# Patient Record
Sex: Female | Born: 1935 | Race: White | Hispanic: No | Marital: Single
Health system: Southern US, Community
[De-identification: ages and names within clinical notes are randomized; demographics above are authoritative.]

## PROBLEM LIST (undated history)

## (undated) DIAGNOSIS — I10 Essential (primary) hypertension: Secondary | ICD-10-CM

## (undated) DIAGNOSIS — N170 Acute kidney failure with tubular necrosis: Secondary | ICD-10-CM

## (undated) DIAGNOSIS — J189 Pneumonia, unspecified organism: Secondary | ICD-10-CM

## (undated) DIAGNOSIS — E039 Hypothyroidism, unspecified: Secondary | ICD-10-CM

## (undated) DIAGNOSIS — E785 Hyperlipidemia, unspecified: Secondary | ICD-10-CM

## (undated) DIAGNOSIS — J869 Pyothorax without fistula: Secondary | ICD-10-CM

## (undated) DIAGNOSIS — I4891 Unspecified atrial fibrillation: Secondary | ICD-10-CM

## (undated) DIAGNOSIS — J9621 Acute and chronic respiratory failure with hypoxia: Secondary | ICD-10-CM

## (undated) DIAGNOSIS — G894 Chronic pain syndrome: Secondary | ICD-10-CM

## (undated) HISTORY — PX: TRACHEOSTOMY: SUR1362

---

## 2017-12-16 ENCOUNTER — Inpatient Hospital Stay
Admission: RE | Admit: 2017-12-16 | Discharge: 2018-01-07 | Disposition: A | Discharge status: Skilled Nursing Facility | Payer: Managed Care, Other (non HMO) | Attending: Internal Medicine | Admitting: Internal Medicine

## 2017-12-16 DIAGNOSIS — K567 Ileus, unspecified: Secondary | ICD-10-CM

## 2017-12-16 DIAGNOSIS — J9621 Acute and chronic respiratory failure with hypoxia: Secondary | ICD-10-CM | POA: Diagnosis present

## 2017-12-16 DIAGNOSIS — N170 Acute kidney failure with tubular necrosis: Secondary | ICD-10-CM | POA: Diagnosis present

## 2017-12-16 DIAGNOSIS — J9690 Respiratory failure, unspecified, unspecified whether with hypoxia or hypercapnia: Secondary | ICD-10-CM

## 2017-12-16 DIAGNOSIS — J869 Pyothorax without fistula: Secondary | ICD-10-CM | POA: Diagnosis present

## 2017-12-16 DIAGNOSIS — J189 Pneumonia, unspecified organism: Secondary | ICD-10-CM | POA: Diagnosis present

## 2017-12-16 DIAGNOSIS — I4891 Unspecified atrial fibrillation: Secondary | ICD-10-CM | POA: Diagnosis present

## 2017-12-16 HISTORY — DX: Essential (primary) hypertension: I10

## 2017-12-16 HISTORY — DX: Hyperlipidemia, unspecified: E78.5

## 2017-12-16 HISTORY — DX: Pneumonia, unspecified organism: J18.9

## 2017-12-16 HISTORY — DX: Chronic pain syndrome: G89.4

## 2017-12-16 HISTORY — DX: Unspecified atrial fibrillation: I48.91

## 2017-12-16 HISTORY — DX: Acute kidney failure with tubular necrosis: N17.0

## 2017-12-16 HISTORY — DX: Pyothorax without fistula: J86.9

## 2017-12-16 HISTORY — DX: Acute and chronic respiratory failure with hypoxia: J96.21

## 2017-12-16 HISTORY — DX: Hypothyroidism, unspecified: E03.9

## 2017-12-17 ENCOUNTER — Other Ambulatory Visit (HOSPITAL_COMMUNITY): Payer: Self-pay

## 2017-12-17 DIAGNOSIS — J189 Pneumonia, unspecified organism: Secondary | ICD-10-CM | POA: Diagnosis not present

## 2017-12-17 DIAGNOSIS — I4891 Unspecified atrial fibrillation: Secondary | ICD-10-CM | POA: Diagnosis not present

## 2017-12-17 DIAGNOSIS — J9621 Acute and chronic respiratory failure with hypoxia: Secondary | ICD-10-CM

## 2017-12-17 DIAGNOSIS — N170 Acute kidney failure with tubular necrosis: Secondary | ICD-10-CM

## 2017-12-17 DIAGNOSIS — J869 Pyothorax without fistula: Secondary | ICD-10-CM

## 2017-12-17 LAB — BASIC METABOLIC PANEL
ANION GAP: 9 (ref 5–15)
BUN: 15 mg/dL (ref 8–23)
CALCIUM: 8.9 mg/dL (ref 8.9–10.3)
CO2: 29 mmol/L (ref 22–32)
CREATININE: 0.59 mg/dL (ref 0.44–1.00)
Chloride: 100 mmol/L (ref 98–111)
GFR calc Af Amer: >60 mL/min (ref >60)
GLUCOSE: 131 mg/dL — AB (ref 70–99)
Potassium: 4 mmol/L (ref 3.5–5.1)
Sodium: 138 mmol/L (ref 135–145)

## 2017-12-17 LAB — LIPID PANEL
CHOL/HDL RATIO: 4.3 ratio
Cholesterol: 166 mg/dL (ref 0–200)
HDL: 39 mg/dL — ABNORMAL LOW (ref >40)
LDL Cholesterol: 100 mg/dL — ABNORMAL HIGH (ref 0–99)
Triglycerides: 137 mg/dL (ref <150)
VLDL: 27 mg/dL (ref 0–40)

## 2017-12-17 LAB — CBC
HCT: 36.2 % (ref 36.0–46.0)
Hemoglobin: 11.5 g/dL — ABNORMAL LOW (ref 12.0–15.0)
MCH: 28.7 pg (ref 26.0–34.0)
MCHC: 31.8 g/dL (ref 30.0–36.0)
MCV: 90.3 fL (ref 80.0–100.0)
NRBC: 0 % (ref 0.0–0.2)
PLATELETS: 216 10*3/uL (ref 150–400)
RBC: 4.01 MIL/uL (ref 3.87–5.11)
RDW: 17.3 % — AB (ref 11.5–15.5)
WBC: 7.7 10*3/uL (ref 4.0–10.5)

## 2017-12-17 LAB — HEMOGLOBIN A1C
Hgb A1c MFr Bld: 6.5 % — ABNORMAL HIGH (ref 4.8–5.6)
MEAN PLASMA GLUCOSE: 139.85 mg/dL

## 2017-12-17 LAB — TSH: TSH: 38.688 u[IU]/mL — ABNORMAL HIGH (ref 0.350–4.500)

## 2017-12-17 LAB — TROPONIN I

## 2017-12-17 LAB — CK: CK TOTAL: 12 U/L — AB (ref 38–234)

## 2017-12-17 MED ORDER — IOPAMIDOL (ISOVUE-300) INJECTION 61%
INTRAVENOUS | Status: AC
  Administered 2017-12-17: 40 mL
  Filled 2017-12-17: qty 50

## 2017-12-17 NOTE — Consult Note (Signed)
Pulmonary Critical Care Medicine [facility] [place]  PULMONARY SERVICE  Date of Service: 12/17/2017  PULMONARY CRITICAL CARE CONSULT   Natasha Jennings  ZOX:[id number]  DOB: January 18, 1936   DOA: 12/16/2017  Referring Physician: Carron Curie, MD  HPI: Natasha Jennings is a 82 y.o. female seen for follow up of Acute on Chronic Respiratory Failure.  Patient transferred to our facility for further management has a past medical history significant for hypertension hypothyroidism hyperlipidemia and chronic pain syndrome.  Patient presented to the hospital because she was having decreased oral intake.  Patient was also having a lot of weakness.  When she was seen in the emergency department she was noted to be in atrial fibrillation.  Her heart rate was significantly elevated and also she was found to be having a bilateral pneumonia.  Patient was started on sepsis protocol admitted to the hospital decompensated in the been the ICU on the ventilator.  Chest x-ray was done which revealed a left-sided empyema she did undergo thoracentesis and then she had a VATS done for drainage of the empyema.  Her course progressed with some improvement however she was not able to wean and eventually ended up having to be trached.  She is now on T collar at the time of presentation to our facility and has been resting on the ventilator on a wean on T collar.  Review of Systems:  ROS performed and is unremarkable other than noted above.  Past Medical History:  Diagnosis Date  . Acute on chronic respiratory failure with hypoxia (HCC)   . Acute renal failure due to tubular necrosis (HCC)   . Atrial fibrillation with rapid ventricular response (HCC)   . Chronic pain syndrome   . Community acquired pneumonia, bilateral   . Empyema of left pleural space (HCC)   . Hyperlipidemia   . Hypertension   . Hypothyroidism     Past Surgical History:  Procedure Laterality Date  . TRACHEOSTOMY      Social History:     has an unknown smoking status. She has never used smokeless tobacco. She reports that she drank alcohol. She reports that she has current or past drug history.  Family History: Non-Contributory to the present illness  Allergies not on file  Medications: Reviewed on Rounds  Physical Exam:  Vitals: Temperature 97.7 pulse 99 respiratory rate 16 blood pressure 158/59 saturations 98%  Ventilator Settings currently off the ventilator on T collar she has a size 8 cuffed trach  . General: Comfortable at this time . Eyes: Grossly normal lids, irises & conjunctiva . ENT: grossly tongue is normal . Neck: no obvious mass . Cardiovascular: S1-S2 normal no gallop or rub . Respiratory: Coarse breath sounds with few rhonchi noted at this time . Abdomen: Soft nontender . Skin: no rash seen on limited exam . Musculoskeletal: not rigid . Psychiatric:unable to assess . Neurologic: no seizure no involuntary movements         Labs on Admission:  Basic Metabolic Panel: Recent Labs  Lab 12/17/17 0049  NA 138  K 4.0  CL 100  CO2 29  GLUCOSE 131*  BUN 15  CREATININE 0.59  CALCIUM 8.9    No results for input(s): PHART, PCO2ART, PO2ART, HCO3, O2SAT in the last 168 hours.  Liver Function Tests: No results for input(s): AST, ALT, ALKPHOS, BILITOT, PROT, ALBUMIN in the last 168 hours. No results for input(s): LIPASE, AMYLASE in the last 168 hours. No results for input(s): AMMONIA in the last  168 hours.  CBC: Recent Labs  Lab 12/17/17 0049  WBC 7.7  HGB 11.5*  HCT 36.2  MCV 90.3  PLT 216    Cardiac Enzymes: Recent Labs  Lab 12/17/17 0049  CKTOTAL 12*  TROPONINI <0.03    BNP (last 3 results) No results for input(s): BNP in the last 8760 hours.  ProBNP (last 3 results) No results for input(s): PROBNP in the last 8760 hours.   Radiological Exams on Admission: Dg Abdomen Peg Tube Location  Result Date: 12/17/2017 CLINICAL DATA:  82 y/o F; PEG tube placement. 40 cc  contrast infused via the tube. EXAM: ABDOMEN - 1 VIEW COMPARISON:  None. FINDINGS: Contrast is seen accumulating within the lumen of the stomach. No extravasation is identified on this single frontal view. Right upper quadrant surgical clips, presumably cholecystectomy. Left femoral line noted with tip projecting to the right of L4 vertebral body. Normal bowel gas pattern. No acute osseous abnormality is evident. IMPRESSION: Contrast is accumulating within the lumen of the stomach. No extravasation is identified on this single frontal view. Electronically Signed   By: Mitzi Hansen M.D.   On: 12/17/2017 01:28   Dg Chest Port 1 View  Result Date: 12/17/2017 CLINICAL DATA:  82 y/o  F; tracheostomy. EXAM: PORTABLE CHEST 1 VIEW COMPARISON:  None. FINDINGS: Mildly enlarged cardiac silhouette given projection and technique. Small bilateral pleural effusions and hazy opacification of the lungs. Tracheostomy tube tip projects over the tracheal air column. Calcific aortic atherosclerosis. Multilevel degenerative changes of the spine. No acute osseous abnormality is evident. IMPRESSION: Small bilateral pleural effusions and hazy opacification of the lungs which may represent pulmonary edema. Underlying pneumonia is not excluded. Tracheostomy tube tip projects over the tracheal air column. Electronically Signed   By: Mitzi Hansen M.D.   On: 12/17/2017 01:26    Assessment/Plan Active Problems:   Acute on chronic respiratory failure with hypoxia (HCC)   Atrial fibrillation with rapid ventricular response (HCC)   Acute renal failure due to tubular necrosis (HCC)   Empyema of left pleural space (HCC)   Community acquired pneumonia, bilateral   1. Acute on chronic respiratory failure with hypoxia patient is off the ventilator at this time has been resting on the ventilator at nighttime apparently at the other facility.  The last chest x-ray that was done showed small bilateral opacifications  with some pulmonary edema.  I would recommend resting on the ventilator and slowly advancing the weans as the underlying pulmonary edema/pneumonia improves.  Patient's husband was present in the room and I discussed it with him he is okay with the current plan 2. Atrial fibrillation with rapid ventricular response right now the rate is controlled overall she is doing better clinically continue to monitor and continue with current medications. 3. Acute renal failure with tubular necrosis stable labs will continue to follow closely. 4. Empyema she had a complicated course with thoracentesis and VATS.  Current chest film shows some haziness need to monitor closely for any recurrence. 5. Community-acquired pneumonia bilateral treated we will continue with supportive care  I have personally seen and evaluated the patient, evaluated laboratory and imaging results, formulated the assessment and plan and placed orders. The Patient requires high complexity decision making for assessment and support.  Case was discussed on Rounds with the Respiratory Therapy Staff Time Spent  Yevonne Pax, MD [facility] Pulmonary Critical Care Medicine Sleep Medicine

## 2017-12-18 DIAGNOSIS — I4891 Unspecified atrial fibrillation: Secondary | ICD-10-CM | POA: Diagnosis not present

## 2017-12-18 DIAGNOSIS — J189 Pneumonia, unspecified organism: Secondary | ICD-10-CM | POA: Diagnosis not present

## 2017-12-18 DIAGNOSIS — J9621 Acute and chronic respiratory failure with hypoxia: Secondary | ICD-10-CM | POA: Diagnosis not present

## 2017-12-18 DIAGNOSIS — N170 Acute kidney failure with tubular necrosis: Secondary | ICD-10-CM | POA: Diagnosis not present

## 2017-12-18 NOTE — Progress Notes (Signed)
Pulmonary Critical Care Medicine [facility] [place]   PULMONARY CRITICAL CARE SERVICE  PROGRESS NOTE  Date of Service: 12/18/2017  Natasha Jennings  [id number]  DOB: Feb 15, 1936   DOA: 12/16/2017  Referring Physician: Carron Curie, MD  HPI: TENNILLE Jennings is a 82 y.o. female seen for follow up of Acute on Chronic Respiratory Failure.  Patient is on T collar this morning she was resting on the ventilator at nighttime.  The trach was changed out to a #6 cuffed trach seems to be doing little bit better.  She is currently on 28% FiO2  Medications: Reviewed on Rounds  Physical Exam:  Vitals: Temperature 98.6 pulse 106 respiratory rate 16 blood pressure 144/76 saturations 100%  Ventilator Settings off the ventilator rate now on T collar  . General: Comfortable at this time . Eyes: Grossly normal lids, irises & conjunctiva . ENT: grossly tongue is normal . Neck: no obvious mass . Cardiovascular: S1 S2 normal no gallop . Respiratory: Coarse breath sounds with few rhonchi . Abdomen: soft . Skin: no rash seen on limited exam . Musculoskeletal: not rigid . Psychiatric:unable to assess . Neurologic: no seizure no involuntary movements         Lab Data:   Basic Metabolic Panel: Recent Labs  Lab 12/17/17 0049  NA 138  K 4.0  CL 100  CO2 29  GLUCOSE 131*  BUN 15  CREATININE 0.59  CALCIUM 8.9    ABG: No results for input(s): PHART, PCO2ART, PO2ART, HCO3, O2SAT in the last 168 hours.  Liver Function Tests: No results for input(s): AST, ALT, ALKPHOS, BILITOT, PROT, ALBUMIN in the last 168 hours. No results for input(s): LIPASE, AMYLASE in the last 168 hours. No results for input(s): AMMONIA in the last 168 hours.  CBC: Recent Labs  Lab 12/17/17 0049  WBC 7.7  HGB 11.5*  HCT 36.2  MCV 90.3  PLT 216    Cardiac Enzymes: Recent Labs  Lab 12/17/17 0049  CKTOTAL 12*  TROPONINI <0.03    BNP (last 3 results) No results for input(s): BNP in the last  8760 hours.  ProBNP (last 3 results) No results for input(s): PROBNP in the last 8760 hours.  Radiological Exams: Dg Abdomen Peg Tube Location  Result Date: 12/17/2017 CLINICAL DATA:  82 y/o F; PEG tube placement. 40 cc contrast infused via the tube. EXAM: ABDOMEN - 1 VIEW COMPARISON:  None. FINDINGS: Contrast is seen accumulating within the lumen of the stomach. No extravasation is identified on this single frontal view. Right upper quadrant surgical clips, presumably cholecystectomy. Left femoral line noted with tip projecting to the right of L4 vertebral body. Normal bowel gas pattern. No acute osseous abnormality is evident. IMPRESSION: Contrast is accumulating within the lumen of the stomach. No extravasation is identified on this single frontal view. Electronically Signed   By: Mitzi Hansen M.D.   On: 12/17/2017 01:28   Dg Chest Port 1 View  Result Date: 12/17/2017 CLINICAL DATA:  82 y/o  F; tracheostomy. EXAM: PORTABLE CHEST 1 VIEW COMPARISON:  None. FINDINGS: Mildly enlarged cardiac silhouette given projection and technique. Small bilateral pleural effusions and hazy opacification of the lungs. Tracheostomy tube tip projects over the tracheal air column. Calcific aortic atherosclerosis. Multilevel degenerative changes of the spine. No acute osseous abnormality is evident. IMPRESSION: Small bilateral pleural effusions and hazy opacification of the lungs which may represent pulmonary edema. Underlying pneumonia is not excluded. Tracheostomy tube tip projects over the tracheal air column. Electronically Signed  By: Mitzi Hansen M.D.   On: 12/17/2017 01:26    Assessment/Plan Active Problems:   Acute on chronic respiratory failure with hypoxia (HCC)   Atrial fibrillation with rapid ventricular response (HCC)   Acute renal failure due to tubular necrosis (HCC)   Empyema of left pleural space (HCC)   Community acquired pneumonia, bilateral   1. Acute on chronic  respiratory failure with hypoxia patient is actually doing well with the T collar trials during the daytime we will continue to advance.  Continue pulmonary toilet supportive care. 2. Atrial fibrillation rapid ventricular response controlled we will continue to monitor. 3. Acute renal failure labs of been stable and normalized 4. Empyema treated follow-up x-rays as necessary. 5. Community-acquired pneumonia bilateral treated we will continue with present therapy   I have personally seen and evaluated the patient, evaluated laboratory and imaging results, formulated the assessment and plan and placed orders. The Patient requires high complexity decision making for assessment and support.  Case was discussed on Rounds with the Respiratory Therapy Staff  Yevonne Pax, MD [facility] Pulmonary Critical Care Medicine Sleep Medicine

## 2017-12-19 DIAGNOSIS — I4891 Unspecified atrial fibrillation: Secondary | ICD-10-CM | POA: Diagnosis not present

## 2017-12-19 DIAGNOSIS — J9621 Acute and chronic respiratory failure with hypoxia: Secondary | ICD-10-CM | POA: Diagnosis not present

## 2017-12-19 DIAGNOSIS — N170 Acute kidney failure with tubular necrosis: Secondary | ICD-10-CM | POA: Diagnosis not present

## 2017-12-19 DIAGNOSIS — J189 Pneumonia, unspecified organism: Secondary | ICD-10-CM | POA: Diagnosis not present

## 2017-12-19 NOTE — Progress Notes (Signed)
Pulmonary Critical Care Medicine [facility] [place]   PULMONARY CRITICAL CARE SERVICE  PROGRESS NOTE  Date of Service: 12/19/2017  Natasha Jennings  ZOX:[id number]  DOB: August 07, 1935   DOA: 12/16/2017  Referring Physician: Carron Curie, MD  HPI: Natasha Jennings is a 82 y.o. female seen for follow up of Acute on Chronic Respiratory Failure.  Patient is on T collar the goal is to try for 12 hours off the ventilator patient was able to do about 10 hours yesterday  Medications: Reviewed on Rounds  Physical Exam:  Vitals: Temperature 97.1 pulse 93 respiratory rate 13 blood pressure 186/88 saturations are 90%  Ventilator Settings currently off the ventilator on T collar 28% FiO2  . General: Comfortable at this time . Eyes: Grossly normal lids, irises & conjunctiva . ENT: grossly tongue is normal . Neck: no obvious mass . Cardiovascular: S1 S2 normal no gallop . Respiratory: Coarse breath sounds with few rhonchi . Abdomen: soft . Skin: no rash seen on limited exam . Musculoskeletal: not rigid . Psychiatric:unable to assess . Neurologic: no seizure no involuntary movements         Lab Data:   Basic Metabolic Panel: Recent Labs  Lab 12/17/17 0049  NA 138  K 4.0  CL 100  CO2 29  GLUCOSE 131*  BUN 15  CREATININE 0.59  CALCIUM 8.9    ABG: No results for input(s): PHART, PCO2ART, PO2ART, HCO3, O2SAT in the last 168 hours.  Liver Function Tests: No results for input(s): AST, ALT, ALKPHOS, BILITOT, PROT, ALBUMIN in the last 168 hours. No results for input(s): LIPASE, AMYLASE in the last 168 hours. No results for input(s): AMMONIA in the last 168 hours.  CBC: Recent Labs  Lab 12/17/17 0049  WBC 7.7  HGB 11.5*  HCT 36.2  MCV 90.3  PLT 216    Cardiac Enzymes: Recent Labs  Lab 12/17/17 0049  CKTOTAL 12*  TROPONINI <0.03    BNP (last 3 results) No results for input(s): BNP in the last 8760 hours.  ProBNP (last 3 results) No results for input(s):  PROBNP in the last 8760 hours.  Radiological Exams: No results found.  Assessment/Plan Active Problems:   Acute on chronic respiratory failure with hypoxia (HCC)   Atrial fibrillation with rapid ventricular response (HCC)   Acute renal failure due to tubular necrosis (HCC)   Empyema of left pleural space (HCC)   Community acquired pneumonia, bilateral   1. Acute on chronic respiratory failure with hypoxia we will continue with the T collar weaning as mentioned the goal is for 12 hours patient was able to do 10 hours yesterday with success continue to advance as tolerated.  Prognosis guarded 2. Atrial fibrillation currently rate controlled we will continue to monitor 3. Acute renal failure resolved 4. Empyema treated status post VATS 5. Community-acquired pneumonia clinically improved she is doing much better   I have personally seen and evaluated the patient, evaluated laboratory and imaging results, formulated the assessment and plan and placed orders. The Patient requires high complexity decision making for assessment and support.  Case was discussed on Rounds with the Respiratory Therapy Staff  Yevonne Pax, MD [facility] Pulmonary Critical Care Medicine Sleep Medicine

## 2017-12-20 ENCOUNTER — Encounter: Payer: Self-pay | Admitting: Internal Medicine

## 2017-12-20 DIAGNOSIS — N170 Acute kidney failure with tubular necrosis: Secondary | ICD-10-CM | POA: Diagnosis not present

## 2017-12-20 DIAGNOSIS — I4891 Unspecified atrial fibrillation: Secondary | ICD-10-CM | POA: Diagnosis present

## 2017-12-20 DIAGNOSIS — J9621 Acute and chronic respiratory failure with hypoxia: Secondary | ICD-10-CM | POA: Diagnosis not present

## 2017-12-20 DIAGNOSIS — J189 Pneumonia, unspecified organism: Secondary | ICD-10-CM | POA: Diagnosis not present

## 2017-12-20 DIAGNOSIS — J869 Pyothorax without fistula: Secondary | ICD-10-CM | POA: Diagnosis present

## 2017-12-20 NOTE — Progress Notes (Signed)
Pulmonary Critical Care Medicine [facility] [place]   PULMONARY CRITICAL CARE SERVICE  PROGRESS NOTE  Date of Service: 12/20/2017  Natasha Jennings  [id number]  DOB: 1936-01-04   DOA: 12/16/2017  Referring Physician: Carron Curie, MD  HPI: Natasha Jennings is a 82 y.o. female seen for follow up of Acute on Chronic Respiratory Failure.  She is doing well goal was for 16 hours on T collar she looks great more awake  Medications: Reviewed on Rounds  Physical Exam:  Vitals: Temperature 97.6 pulse 99 respiratory rate 18 blood pressure 155/74 saturations 99%  Ventilator Settings off the ventilator right now on T collar  . General: Comfortable at this time . Eyes: Grossly normal lids, irises & conjunctiva . ENT: grossly tongue is normal . Neck: no obvious mass . Cardiovascular: S1 S2 normal no gallop . Respiratory: No rhonchi or rales are noted . Abdomen: soft . Skin: no rash seen on limited exam . Musculoskeletal: not rigid . Psychiatric:unable to assess . Neurologic: no seizure no involuntary movements         Lab Data:   Basic Metabolic Panel: Recent Labs  Lab 12/17/17 0049  NA 138  K 4.0  CL 100  CO2 29  GLUCOSE 131*  BUN 15  CREATININE 0.59  CALCIUM 8.9    ABG: No results for input(s): PHART, PCO2ART, PO2ART, HCO3, O2SAT in the last 168 hours.  Liver Function Tests: No results for input(s): AST, ALT, ALKPHOS, BILITOT, PROT, ALBUMIN in the last 168 hours. No results for input(s): LIPASE, AMYLASE in the last 168 hours. No results for input(s): AMMONIA in the last 168 hours.  CBC: Recent Labs  Lab 12/17/17 0049  WBC 7.7  HGB 11.5*  HCT 36.2  MCV 90.3  PLT 216    Cardiac Enzymes: Recent Labs  Lab 12/17/17 0049  CKTOTAL 12*  TROPONINI <0.03    BNP (last 3 results) No results for input(s): BNP in the last 8760 hours.  ProBNP (last 3 results) No results for input(s): PROBNP in the last 8760 hours.  Radiological Exams: No  results found.  Assessment/Plan Active Problems:   Acute on chronic respiratory failure with hypoxia (HCC)   Atrial fibrillation with rapid ventricular response (HCC)   Acute renal failure due to tubular necrosis (HCC)   Empyema of left pleural space (HCC)   Community acquired pneumonia, bilateral   1. Acute on chronic respiratory failure with hypoxia doing well 16-hour goal off the ventilator. 2. Atrial fibrillation with rapid ventricular response treated clinically improved 3. Acute renal failure resolved 4. Empyema status post VATS 5. Community acquired pneumonia bilateral treated we will monitor   I have personally seen and evaluated the patient, evaluated laboratory and imaging results, formulated the assessment and plan and placed orders. The Patient requires high complexity decision making for assessment and support.  Case was discussed on Rounds with the Respiratory Therapy Staff  Yevonne Pax, MD [facility] Pulmonary Critical Care Medicine Sleep Medicine

## 2017-12-21 ENCOUNTER — Other Ambulatory Visit (HOSPITAL_COMMUNITY): Payer: Self-pay

## 2017-12-21 DIAGNOSIS — J9621 Acute and chronic respiratory failure with hypoxia: Secondary | ICD-10-CM | POA: Diagnosis not present

## 2017-12-21 DIAGNOSIS — N170 Acute kidney failure with tubular necrosis: Secondary | ICD-10-CM | POA: Diagnosis not present

## 2017-12-21 DIAGNOSIS — J189 Pneumonia, unspecified organism: Secondary | ICD-10-CM | POA: Diagnosis not present

## 2017-12-21 DIAGNOSIS — I4891 Unspecified atrial fibrillation: Secondary | ICD-10-CM | POA: Diagnosis not present

## 2017-12-21 NOTE — Progress Notes (Signed)
Pulmonary Critical Care Medicine [facility] [place]   PULMONARY CRITICAL CARE SERVICE  PROGRESS NOTE  Date of Service: 12/21/2017  Natasha Jennings  [id number]  DOB: 09-Dec-1935   DOA: 12/16/2017  Referring Physician: Carron Curie, MD  HPI: Natasha Jennings is a 82 y.o. female seen for follow up of Acute on Chronic Respiratory Failure.  She is doing well sitting up in chair family was present and they were updated.  She is going to wean on T collar for a goal of 20 hours today  Medications: Reviewed on Rounds  Physical Exam:  Vitals: Temperature 96.2 pulse 90 respiratory rate 20 blood pressure 132/78 saturations 100%  Ventilator Settings off the ventilator on T collar FiO2 28%  . General: Comfortable at this time . Eyes: Grossly normal lids, irises & conjunctiva . ENT: grossly tongue is normal . Neck: no obvious mass . Cardiovascular: S1 S2 normal no gallop . Respiratory: Scattered distant rhonchi are noted at this time . Abdomen: soft . Skin: no rash seen on limited exam . Musculoskeletal: not rigid . Psychiatric:unable to assess . Neurologic: no seizure no involuntary movements         Lab Data:   Basic Metabolic Panel: Recent Labs  Lab 12/17/17 0049  NA 138  K 4.0  CL 100  CO2 29  GLUCOSE 131*  BUN 15  CREATININE 0.59  CALCIUM 8.9    ABG: No results for input(s): PHART, PCO2ART, PO2ART, HCO3, O2SAT in the last 168 hours.  Liver Function Tests: No results for input(s): AST, ALT, ALKPHOS, BILITOT, PROT, ALBUMIN in the last 168 hours. No results for input(s): LIPASE, AMYLASE in the last 168 hours. No results for input(s): AMMONIA in the last 168 hours.  CBC: Recent Labs  Lab 12/17/17 0049  WBC 7.7  HGB 11.5*  HCT 36.2  MCV 90.3  PLT 216    Cardiac Enzymes: Recent Labs  Lab 12/17/17 0049  CKTOTAL 12*  TROPONINI <0.03    BNP (last 3 results) No results for input(s): BNP in the last 8760 hours.  ProBNP (last 3 results) No  results for input(s): PROBNP in the last 8760 hours.  Radiological Exams: No results found.  Assessment/Plan Active Problems:   Acute on chronic respiratory failure with hypoxia (HCC)   Atrial fibrillation with rapid ventricular response (HCC)   Acute renal failure due to tubular necrosis (HCC)   Empyema of left pleural space (HCC)   Community acquired pneumonia, bilateral   1. Acute on chronic respiratory failure patient is doing well with a goal of 20 hours on the T collar right now oxygen requirements are minimal 2. Atrial fibrillation rapid response rate is controlled 3. Acute renal failure resolved 4. Empyema status post VATS 5. Community-acquired pneumonia treated we will continue to follow along with   I have personally seen and evaluated the patient, evaluated laboratory and imaging results, formulated the assessment and plan and placed orders. The Patient requires high complexity decision making for assessment and support.  Case was discussed on Rounds with the Respiratory Therapy Staff  Yevonne Pax, MD [facility] Pulmonary Critical Care Medicine Sleep Medicine

## 2017-12-22 DIAGNOSIS — J9621 Acute and chronic respiratory failure with hypoxia: Secondary | ICD-10-CM | POA: Diagnosis not present

## 2017-12-22 DIAGNOSIS — I4891 Unspecified atrial fibrillation: Secondary | ICD-10-CM | POA: Diagnosis not present

## 2017-12-22 DIAGNOSIS — N170 Acute kidney failure with tubular necrosis: Secondary | ICD-10-CM | POA: Diagnosis not present

## 2017-12-22 DIAGNOSIS — J189 Pneumonia, unspecified organism: Secondary | ICD-10-CM | POA: Diagnosis not present

## 2017-12-22 LAB — TROPONIN I

## 2017-12-22 NOTE — Progress Notes (Signed)
Pulmonary Critical Care Medicine [facility] [place]   PULMONARY CRITICAL CARE SERVICE  PROGRESS NOTE  Date of Service: 12/22/2017  Natasha Jennings  ZOX:[id number]  DOB: 08-Dec-1935   DOA: 12/16/2017  Referring Physician: Carron Curie, MD  HPI: Natasha Jennings is a 82 y.o. female seen for follow up of Acute on Chronic Respiratory Failure.  Patient is on T collar currently is a goal of 24 hours comfortable without distress  Medications: Reviewed on Rounds  Physical Exam:  Vitals: Temperature is 98 pulse 95 respiratory rate 12 blood pressure 1 7481 saturations 100%  Ventilator Settings currently off the ventilator on T collar  . General: Comfortable at this time . Eyes: Grossly normal lids, irises & conjunctiva . ENT: grossly tongue is normal . Neck: no obvious mass . Cardiovascular: S1 S2 normal no gallop . Respiratory: No rhonchi no rales are noted at this time . Abdomen: soft . Skin: no rash seen on limited exam . Musculoskeletal: not rigid . Psychiatric:unable to assess . Neurologic: no seizure no involuntary movements         Lab Data:   Basic Metabolic Panel: Recent Labs  Lab 12/17/17 0049  NA 138  K 4.0  CL 100  CO2 29  GLUCOSE 131*  BUN 15  CREATININE 0.59  CALCIUM 8.9    ABG: No results for input(s): PHART, PCO2ART, PO2ART, HCO3, O2SAT in the last 168 hours.  Liver Function Tests: No results for input(s): AST, ALT, ALKPHOS, BILITOT, PROT, ALBUMIN in the last 168 hours. No results for input(s): LIPASE, AMYLASE in the last 168 hours. No results for input(s): AMMONIA in the last 168 hours.  CBC: Recent Labs  Lab 12/17/17 0049  WBC 7.7  HGB 11.5*  HCT 36.2  MCV 90.3  PLT 216    Cardiac Enzymes: Recent Labs  Lab 12/17/17 0049  CKTOTAL 12*  TROPONINI <0.03    BNP (last 3 results) No results for input(s): BNP in the last 8760 hours.  ProBNP (last 3 results) No results for input(s): PROBNP in the last 8760  hours.  Radiological Exams: Dg Abd Portable 1v  Result Date: 12/21/2017 CLINICAL DATA:  Peg placement EXAM: PORTABLE ABDOMEN - 1 VIEW COMPARISON:  12/17/2017 FINDINGS: Gastrostomy catheter is noted overlying the gastric lumen although no contrast was administered for this exam to confirm placement. Contrast from a previous injection is noted within the colon. No free air is seen. IMPRESSION: Gastrostomy catheter overlying the gastric lumen. Electronically Signed   By: Alcide Clever M.D.   On: 12/21/2017 17:18    Assessment/Plan Active Problems:   Acute on chronic respiratory failure with hypoxia (HCC)   Atrial fibrillation with rapid ventricular response (HCC)   Acute renal failure due to tubular necrosis (HCC)   Empyema of left pleural space (HCC)   Community acquired pneumonia, bilateral   1. Acute on chronic respiratory failure with hypoxia we will continue with T collar goal is 24 hours continue pulmonary toilet secretion management. 2. Atrial fibrillation currently rate controlled we will monitor 3. Acute renal failure labs are improving 4. Empyema treated status post VATS 5. Community-acquired pneumonia treated we will continue to monitor radiologically   I have personally seen and evaluated the patient, evaluated laboratory and imaging results, formulated the assessment and plan and placed orders. The Patient requires high complexity decision making for assessment and support.  Case was discussed on Rounds with the Respiratory Therapy Staff  Yevonne Pax, MD [facility] Pulmonary Critical Care Medicine Sleep  Medicine

## 2017-12-23 DIAGNOSIS — J189 Pneumonia, unspecified organism: Secondary | ICD-10-CM | POA: Diagnosis not present

## 2017-12-23 DIAGNOSIS — N170 Acute kidney failure with tubular necrosis: Secondary | ICD-10-CM | POA: Diagnosis not present

## 2017-12-23 DIAGNOSIS — I4891 Unspecified atrial fibrillation: Secondary | ICD-10-CM | POA: Diagnosis not present

## 2017-12-23 DIAGNOSIS — J9621 Acute and chronic respiratory failure with hypoxia: Secondary | ICD-10-CM | POA: Diagnosis not present

## 2017-12-23 LAB — CBC
HEMATOCRIT: 42.6 % (ref 36.0–46.0)
HEMOGLOBIN: 13.5 g/dL (ref 12.0–15.0)
MCH: 28.8 pg (ref 26.0–34.0)
MCHC: 31.7 g/dL (ref 30.0–36.0)
MCV: 90.8 fL (ref 80.0–100.0)
NRBC: 0 % (ref 0.0–0.2)
Platelets: 263 10*3/uL (ref 150–400)
RBC: 4.69 MIL/uL (ref 3.87–5.11)
RDW: 18.8 % — ABNORMAL HIGH (ref 11.5–15.5)
WBC: 16.7 10*3/uL — AB (ref 4.0–10.5)

## 2017-12-23 LAB — HEMOGLOBIN A1C
Hgb A1c MFr Bld: 6.5 % — ABNORMAL HIGH (ref 4.8–5.6)
Mean Plasma Glucose: 139.85 mg/dL

## 2017-12-23 LAB — BASIC METABOLIC PANEL
Anion gap: 10 (ref 5–15)
BUN: 37 mg/dL — AB (ref 8–23)
CHLORIDE: 99 mmol/L (ref 98–111)
CO2: 29 mmol/L (ref 22–32)
Calcium: 9 mg/dL (ref 8.9–10.3)
Creatinine, Ser: 0.78 mg/dL (ref 0.44–1.00)
GFR calc Af Amer: >60 mL/min (ref >60)
GFR calc non Af Amer: >60 mL/min (ref >60)
Glucose, Bld: 191 mg/dL — ABNORMAL HIGH (ref 70–99)
Potassium: 5.3 mmol/L — ABNORMAL HIGH (ref 3.5–5.1)
SODIUM: 138 mmol/L (ref 135–145)

## 2017-12-23 LAB — POTASSIUM: Potassium: 4.5 mmol/L (ref 3.5–5.1)

## 2017-12-23 NOTE — Progress Notes (Signed)
Pulmonary Critical Care Medicine [facility] [place]   PULMONARY CRITICAL CARE SERVICE  PROGRESS NOTE  Date of Service: 12/23/2017  Natasha Jennings  [id number]  DOB: 1935/03/06   DOA: 12/16/2017  Referring Physician: Carron Curie, MD  HPI: Natasha Jennings is a 82 y.o. female seen for follow up of Acute on Chronic Respiratory Failure.  Patient is on T collar at this time has been off the ventilator for more than 48 hours she is doing well  Medications: Reviewed on Rounds  Physical Exam:  Vitals: Temperature 96.6 pulse 111 respiratory rate 23 blood pressure 157/101 saturations 100%  Ventilator Settings on T collar 28% FiO2  . General: Comfortable at this time . Eyes: Grossly normal lids, irises & conjunctiva . ENT: grossly tongue is normal . Neck: no obvious mass . Cardiovascular: S1 S2 normal no gallop . Respiratory: No rhonchi no rales . Abdomen: soft . Skin: no rash seen on limited exam . Musculoskeletal: not rigid . Psychiatric:unable to assess . Neurologic: no seizure no involuntary movements         Lab Data:   Basic Metabolic Panel: Recent Labs  Lab 12/17/17 0049 12/23/17 0559  NA 138 138  K 4.0 5.3*  CL 100 99  CO2 29 29  GLUCOSE 131* 191*  BUN 15 37*  CREATININE 0.59 0.78  CALCIUM 8.9 9.0    ABG: No results for input(s): PHART, PCO2ART, PO2ART, HCO3, O2SAT in the last 168 hours.  Liver Function Tests: No results for input(s): AST, ALT, ALKPHOS, BILITOT, PROT, ALBUMIN in the last 168 hours. No results for input(s): LIPASE, AMYLASE in the last 168 hours. No results for input(s): AMMONIA in the last 168 hours.  CBC: Recent Labs  Lab 12/17/17 0049 12/23/17 0559  WBC 7.7 16.7*  HGB 11.5* 13.5  HCT 36.2 42.6  MCV 90.3 90.8  PLT 216 263    Cardiac Enzymes: Recent Labs  Lab 12/17/17 0049 12/22/17 1409 12/22/17 1557 12/22/17 2129  CKTOTAL 12*  --   --   --   TROPONINI <0.03 <0.03 <0.03 <0.03    BNP (last 3 results) No  results for input(s): BNP in the last 8760 hours.  ProBNP (last 3 results) No results for input(s): PROBNP in the last 8760 hours.  Radiological Exams: Dg Abd Portable 1v  Result Date: 12/21/2017 CLINICAL DATA:  Peg placement EXAM: PORTABLE ABDOMEN - 1 VIEW COMPARISON:  12/17/2017 FINDINGS: Gastrostomy catheter is noted overlying the gastric lumen although no contrast was administered for this exam to confirm placement. Contrast from a previous injection is noted within the colon. No free air is seen. IMPRESSION: Gastrostomy catheter overlying the gastric lumen. Electronically Signed   By: Alcide Clever M.D.   On: 12/21/2017 17:18    Assessment/Plan Active Problems:   Acute on chronic respiratory failure with hypoxia (HCC)   Atrial fibrillation with rapid ventricular response (HCC)   Acute renal failure due to tubular necrosis (HCC)   Empyema of left pleural space (HCC)   Community acquired pneumonia, bilateral   1. Acute on chronic respiratory failure with hypoxia we will continue with weaning on T collar continue secretion management pulmonary toilet 2. Atrial fibrillation rate is controlled 3. Acute renal failure resolved 4. Empyema treated follow x-rays for recurrence status post VATS 5. Community-acquired pneumonia treated continue with supportive care follow x-rays   I have personally seen and evaluated the patient, evaluated laboratory and imaging results, formulated the assessment and plan and placed orders. The Patient requires  high complexity decision making for assessment and support.  Case was discussed on Rounds with the Respiratory Therapy Staff  Allyne Gee, MD [facility] Pulmonary Critical Care Medicine Sleep Medicine

## 2017-12-24 DIAGNOSIS — J9621 Acute and chronic respiratory failure with hypoxia: Secondary | ICD-10-CM | POA: Diagnosis not present

## 2017-12-24 DIAGNOSIS — I4891 Unspecified atrial fibrillation: Secondary | ICD-10-CM | POA: Diagnosis not present

## 2017-12-24 DIAGNOSIS — N170 Acute kidney failure with tubular necrosis: Secondary | ICD-10-CM | POA: Diagnosis not present

## 2017-12-24 DIAGNOSIS — J189 Pneumonia, unspecified organism: Secondary | ICD-10-CM | POA: Diagnosis not present

## 2017-12-24 NOTE — Progress Notes (Signed)
Pulmonary Critical Care Medicine [facility] [place]   PULMONARY CRITICAL CARE SERVICE  PROGRESS NOTE  Date of Service: 12/24/2017  Natasha Jennings  [id number]  DOB: 1935-03-26   DOA: 12/16/2017  Referring Physician: Carron Curie, MD  HPI: Natasha Jennings is a 82 y.o. female seen for follow up of Acute on Chronic Respiratory Failure.  Patient is on T collar at this time is been on 28% oxygen good saturations are noted.  Medications: Reviewed on Rounds  Physical Exam:  Vitals: Temperature 96.0 pulse 104 respiratory rate 20 blood pressure 161/89 saturations 100%  Ventilator Settings off the ventilator on T collar with 28% FiO2  . General: Comfortable at this time . Eyes: Grossly normal lids, irises & conjunctiva . ENT: grossly tongue is normal . Neck: no obvious mass . Cardiovascular: S1 S2 normal no gallop . Respiratory: No rhonchi or rales are noted . Abdomen: soft . Skin: no rash seen on limited exam . Musculoskeletal: not rigid . Psychiatric:unable to assess . Neurologic: no seizure no involuntary movements         Lab Data:   Basic Metabolic Panel: Recent Labs  Lab 12/23/17 0559 12/23/17 2050  NA 138  --   K 5.3* 4.5  CL 99  --   CO2 29  --   GLUCOSE 191*  --   BUN 37*  --   CREATININE 0.78  --   CALCIUM 9.0  --     ABG: No results for input(s): PHART, PCO2ART, PO2ART, HCO3, O2SAT in the last 168 hours.  Liver Function Tests: No results for input(s): AST, ALT, ALKPHOS, BILITOT, PROT, ALBUMIN in the last 168 hours. No results for input(s): LIPASE, AMYLASE in the last 168 hours. No results for input(s): AMMONIA in the last 168 hours.  CBC: Recent Labs  Lab 12/23/17 0559  WBC 16.7*  HGB 13.5  HCT 42.6  MCV 90.8  PLT 263    Cardiac Enzymes: Recent Labs  Lab 12/22/17 1409 12/22/17 1557 12/22/17 2129  TROPONINI <0.03 <0.03 <0.03    BNP (last 3 results) No results for input(s): BNP in the last 8760 hours.  ProBNP (last 3  results) No results for input(s): PROBNP in the last 8760 hours.  Radiological Exams: No results found.  Assessment/Plan Active Problems:   Acute on chronic respiratory failure with hypoxia (HCC)   Atrial fibrillation with rapid ventricular response (HCC)   Acute renal failure due to tubular necrosis (HCC)   Empyema of left pleural space (HCC)   Community acquired pneumonia, bilateral   1. Acute on chronic respiratory failure with hypoxia we will continue with T collar trials continue pulmonary toilet secretion management.  Patient is doing well and tracheostomy should be changed soon 2. Atrial fibrillation rate is controlled at this time continue to monitor 3. Acute renal failure labs are improving we will continue support 4. Empyema treated status post VATS 5. Community-acquired pneumonia treated with antibiotics we will continue to monitor   I have personally seen and evaluated the patient, evaluated laboratory and imaging results, formulated the assessment and plan and placed orders. The Patient requires high complexity decision making for assessment and support.  Case was discussed on Rounds with the Respiratory Therapy Staff  Yevonne Pax, MD [facility] Pulmonary Critical Care Medicine Sleep Medicine

## 2017-12-25 DIAGNOSIS — J9621 Acute and chronic respiratory failure with hypoxia: Secondary | ICD-10-CM | POA: Diagnosis not present

## 2017-12-25 DIAGNOSIS — N170 Acute kidney failure with tubular necrosis: Secondary | ICD-10-CM | POA: Diagnosis not present

## 2017-12-25 DIAGNOSIS — I4891 Unspecified atrial fibrillation: Secondary | ICD-10-CM | POA: Diagnosis not present

## 2017-12-25 DIAGNOSIS — J189 Pneumonia, unspecified organism: Secondary | ICD-10-CM | POA: Diagnosis not present

## 2017-12-25 NOTE — Progress Notes (Signed)
Pulmonary Critical Care Medicine [facility] [place]   PULMONARY CRITICAL CARE SERVICE  PROGRESS NOTE  Date of Service: 12/25/2017  Natasha Jennings  [id number]  DOB: 11-04-1935   DOA: 12/16/2017  Referring Physician: Carron Curie, MD  HPI: Natasha Jennings is a 82 y.o. female seen for follow up of Acute on Chronic Respiratory Failure.  Patient is doing well on T collar.  Secretions are reportedly minimal to none patient is currently on 28% FiO2  Medications: Reviewed on Rounds  Physical Exam:  Vitals: Temperature 96.2 pulse 106 respiratory rate 15 blood pressure 150/99 saturations 97%  Ventilator Settings currently is on T collar with an FiO2 of 28%  . General: Comfortable at this time . Eyes: Grossly normal lids, irises & conjunctiva . ENT: grossly tongue is normal . Neck: no obvious mass . Cardiovascular: S1 S2 normal no gallop . Respiratory: Coarse breath sounds few rhonchi . Abdomen: soft . Skin: no rash seen on limited exam . Musculoskeletal: not rigid . Psychiatric:unable to assess . Neurologic: no seizure no involuntary movements         Lab Data:   Basic Metabolic Panel: Recent Labs  Lab 12/23/17 0559 12/23/17 2050  NA 138  --   K 5.3* 4.5  CL 99  --   CO2 29  --   GLUCOSE 191*  --   BUN 37*  --   CREATININE 0.78  --   CALCIUM 9.0  --     ABG: No results for input(s): PHART, PCO2ART, PO2ART, HCO3, O2SAT in the last 168 hours.  Liver Function Tests: No results for input(s): AST, ALT, ALKPHOS, BILITOT, PROT, ALBUMIN in the last 168 hours. No results for input(s): LIPASE, AMYLASE in the last 168 hours. No results for input(s): AMMONIA in the last 168 hours.  CBC: Recent Labs  Lab 12/23/17 0559  WBC 16.7*  HGB 13.5  HCT 42.6  MCV 90.8  PLT 263    Cardiac Enzymes: Recent Labs  Lab 12/22/17 1409 12/22/17 1557 12/22/17 2129  TROPONINI <0.03 <0.03 <0.03    BNP (last 3 results) No results for input(s): BNP in the last 8760  hours.  ProBNP (last 3 results) No results for input(s): PROBNP in the last 8760 hours.  Radiological Exams: No results found.  Assessment/Plan Active Problems:   Acute on chronic respiratory failure with hypoxia (HCC)   Atrial fibrillation with rapid ventricular response (HCC)   Acute renal failure due to tubular necrosis (HCC)   Empyema of left pleural space (HCC)   Community acquired pneumonia, bilateral   1. Acute on chronic respiratory failure with hypoxia patient is doing better with weaning will continue to advance hopefully next week which should be able to start capping 2. Chronic atrial fibrillation rate is controlled 3. Acute renal failure labs are improved 4. Empyema status post VATS 5. Community-acquired pneumonia treated we will continue to follow radiologically as necessary   I have personally seen and evaluated the patient, evaluated laboratory and imaging results, formulated the assessment and plan and placed orders. The Patient requires high complexity decision making for assessment and support.  Case was discussed on Rounds with the Respiratory Therapy Staff  Yevonne Pax, MD [facility] Pulmonary Critical Care Medicine Sleep Medicine

## 2017-12-26 DIAGNOSIS — I4891 Unspecified atrial fibrillation: Secondary | ICD-10-CM | POA: Diagnosis not present

## 2017-12-26 DIAGNOSIS — N170 Acute kidney failure with tubular necrosis: Secondary | ICD-10-CM | POA: Diagnosis not present

## 2017-12-26 DIAGNOSIS — J9621 Acute and chronic respiratory failure with hypoxia: Secondary | ICD-10-CM | POA: Diagnosis not present

## 2017-12-26 DIAGNOSIS — J189 Pneumonia, unspecified organism: Secondary | ICD-10-CM | POA: Diagnosis not present

## 2017-12-26 NOTE — Progress Notes (Signed)
Pulmonary Critical Care Medicine [facility] [place]   PULMONARY CRITICAL CARE SERVICE  PROGRESS NOTE  Date of Service: 12/26/2017  Natasha Jennings  ZOX:[id number]  DOB: [birth year]   DOA: 12/16/2017  Referring Physician: Carron Curie, MD  HPI: Natasha Jennings is a 82 y.o. female seen for follow up of Acute on Chronic Respiratory Failure.  Renal afebrile patient's been off the ventilator for more than 24 hours  Medications: Reviewed on Rounds  Physical Exam:  Vitals: Temperature 97.1 pulse 100 respiratory rate 16 blood pressure 150/100 saturations 91%  Ventilator Settings off the ventilator on T collar  . General: Comfortable at this time . Eyes: Grossly normal lids, irises & conjunctiva . ENT: grossly tongue is normal . Neck: no obvious mass . Cardiovascular: S1 S2 normal no gallop . Respiratory: Coarse breath sounds no rhonchi . Abdomen: soft . Skin: no rash seen on limited exam . Musculoskeletal: not rigid . Psychiatric:unable to assess . Neurologic: no seizure no involuntary movements         Lab Data:   Basic Metabolic Panel: Recent Labs  Lab 12/23/17 0559 12/23/17 2050  NA 138  --   K 5.3* 4.5  CL 99  --   CO2 29  --   GLUCOSE 191*  --   BUN 37*  --   CREATININE 0.78  --   CALCIUM 9.0  --     ABG: No results for input(s): PHART, PCO2ART, PO2ART, HCO3, O2SAT in the last 168 hours.  Liver Function Tests: No results for input(s): AST, ALT, ALKPHOS, BILITOT, PROT, ALBUMIN in the last 168 hours. No results for input(s): LIPASE, AMYLASE in the last 168 hours. No results for input(s): AMMONIA in the last 168 hours.  CBC: Recent Labs  Lab 12/23/17 0559  WBC 16.7*  HGB 13.5  HCT 42.6  MCV 90.8  PLT 263    Cardiac Enzymes: Recent Labs  Lab 12/22/17 1409 12/22/17 1557 12/22/17 2129  TROPONINI <0.03 <0.03 <0.03    BNP (last 3 results) No results for input(s): BNP in the last 8760 hours.  ProBNP (last 3 results) No results for  input(s): PROBNP in the last 8760 hours.  Radiological Exams: No results found.  Assessment/Plan Active Problems:   Acute on chronic respiratory failure with hypoxia (HCC)   Atrial fibrillation with rapid ventricular response (HCC)   Acute renal failure due to tubular necrosis (HCC)   Empyema of left pleural space (HCC)   Community acquired pneumonia, bilateral   1. Acute on chronic respiratory failure with hypoxia we will continue weaning on T collar as tolerated.  She is making good progress so far continue with pulmonary toilet 2. Atrial fibrillation rate is controlled we will continue to monitor 3. Acute renal failure due to tubular necrosis improving 4. Empyema status post VATS 5. Community-acquired pneumonia treated we will continue to monitor radiologically   I have personally seen and evaluated the patient, evaluated laboratory and imaging results, formulated the assessment and plan and placed orders. The Patient requires high complexity decision making for assessment and support.  Case was discussed on Rounds with the Respiratory Therapy Staff  Yevonne Pax, MD [facility] Pulmonary Critical Care Medicine Sleep Medicine

## 2017-12-27 DIAGNOSIS — I4891 Unspecified atrial fibrillation: Secondary | ICD-10-CM | POA: Diagnosis not present

## 2017-12-27 DIAGNOSIS — N170 Acute kidney failure with tubular necrosis: Secondary | ICD-10-CM | POA: Diagnosis not present

## 2017-12-27 DIAGNOSIS — J189 Pneumonia, unspecified organism: Secondary | ICD-10-CM | POA: Diagnosis not present

## 2017-12-27 DIAGNOSIS — J9621 Acute and chronic respiratory failure with hypoxia: Secondary | ICD-10-CM | POA: Diagnosis not present

## 2017-12-27 NOTE — Progress Notes (Signed)
Pulmonary Critical Care Medicine [facility] [place]   PULMONARY CRITICAL CARE SERVICE  PROGRESS NOTE  Date of Service: 12/27/2017  Natasha Jennings  [id number]  DOB: June 10, 1935   DOA: 12/16/2017  Referring Physician: Carron Curie, MD  HPI: Natasha Jennings is a 82 y.o. female seen for follow up of Acute on Chronic Respiratory Failure.  Patient is on T collar she is doing well secretions are minimal  Medications: Reviewed on Rounds  Physical Exam:  Vitals: Temperature 97.5 pulse 104 respiratory rate 12 blood pressure 151/84 saturations 99%  Ventilator Settings T collar FiO2 28%  . General: Comfortable at this time . Eyes: Grossly normal lids, irises & conjunctiva . ENT: grossly tongue is normal . Neck: no obvious mass . Cardiovascular: S1 S2 normal no gallop . Respiratory: Coarse breath sounds no rhonchi . Abdomen: soft . Skin: no rash seen on limited exam . Musculoskeletal: not rigid . Psychiatric:unable to assess . Neurologic: no seizure no involuntary movements         Lab Data:   Basic Metabolic Panel: Recent Labs  Lab 12/23/17 0559 12/23/17 2050  NA 138  --   K 5.3* 4.5  CL 99  --   CO2 29  --   GLUCOSE 191*  --   BUN 37*  --   CREATININE 0.78  --   CALCIUM 9.0  --     ABG: No results for input(s): PHART, PCO2ART, PO2ART, HCO3, O2SAT in the last 168 hours.  Liver Function Tests: No results for input(s): AST, ALT, ALKPHOS, BILITOT, PROT, ALBUMIN in the last 168 hours. No results for input(s): LIPASE, AMYLASE in the last 168 hours. No results for input(s): AMMONIA in the last 168 hours.  CBC: Recent Labs  Lab 12/23/17 0559  WBC 16.7*  HGB 13.5  HCT 42.6  MCV 90.8  PLT 263    Cardiac Enzymes: Recent Labs  Lab 12/22/17 1409 12/22/17 1557 12/22/17 2129  TROPONINI <0.03 <0.03 <0.03    BNP (last 3 results) No results for input(s): BNP in the last 8760 hours.  ProBNP (last 3 results) No results for input(s): PROBNP in the  last 8760 hours.  Radiological Exams: No results found.  Assessment/Plan Active Problems:   Acute on chronic respiratory failure with hypoxia (HCC)   Atrial fibrillation with rapid ventricular response (HCC)   Acute renal failure due to tubular necrosis (HCC)   Empyema of left pleural space (HCC)   Community acquired pneumonia, bilateral   1. Acute on chronic respiratory failure with hypoxia improving clinically as minimal secretions will hopefully be able to progress to capping 2. Atrial fibrillation rate is controlled this time 3. Acute on chronic renal failure follow-up on labs stable 4. Empyema status post VATS 5. Community-acquired pneumonia treated we will monitor   I have personally seen and evaluated the patient, evaluated laboratory and imaging results, formulated the assessment and plan and placed orders. The Patient requires high complexity decision making for assessment and support.  Case was discussed on Rounds with the Respiratory Therapy Staff  Yevonne Pax, MD [facility] Pulmonary Critical Care Medicine Sleep Medicine

## 2017-12-28 DIAGNOSIS — J9621 Acute and chronic respiratory failure with hypoxia: Secondary | ICD-10-CM | POA: Diagnosis not present

## 2017-12-28 DIAGNOSIS — N170 Acute kidney failure with tubular necrosis: Secondary | ICD-10-CM | POA: Diagnosis not present

## 2017-12-28 DIAGNOSIS — J189 Pneumonia, unspecified organism: Secondary | ICD-10-CM | POA: Diagnosis not present

## 2017-12-28 DIAGNOSIS — I4891 Unspecified atrial fibrillation: Secondary | ICD-10-CM | POA: Diagnosis not present

## 2017-12-28 LAB — TSH: TSH: 0.876 u[IU]/mL (ref 0.350–4.500)

## 2017-12-28 NOTE — Progress Notes (Signed)
Pulmonary Critical Care Medicine [facility] [place]   PULMONARY CRITICAL CARE SERVICE  PROGRESS NOTE  Date of Service: 12/28/2017  [name]  ZOX:[id number]  DOB: [birth year]   DOA: 12/16/2017  Referring Physician: Carron Curie, MD  HPI: Natasha Jennings is a 82 y.o. female seen for follow up of Acute on Chronic Respiratory Failure.  Patient is on T collar she has done well she is been doing PMV she should be able to start capping  Medications: Reviewed on Rounds  Physical Exam:  Vitals: Temperature 98.5 pulse 95 respiratory 14 blood pressure 151/80 saturations 93%  Ventilator Settings on T collar right now 28% FiO2 good saturations are noted  . General: Comfortable at this time . Eyes: Grossly normal lids, irises & conjunctiva . ENT: grossly tongue is normal . Neck: no obvious mass . Cardiovascular: S1 S2 normal no gallop . Respiratory: No rhonchi or rales are noted at this time . Abdomen: soft . Skin: no rash seen on limited exam . Musculoskeletal: not rigid . Psychiatric:unable to assess . Neurologic: no seizure no involuntary movements         Lab Data:   Basic Metabolic Panel: Recent Labs  Lab 12/23/17 0559 12/23/17 2050  NA 138  --   K 5.3* 4.5  CL 99  --   CO2 29  --   GLUCOSE 191*  --   BUN 37*  --   CREATININE 0.78  --   CALCIUM 9.0  --     ABG: No results for input(s): PHART, PCO2ART, PO2ART, HCO3, O2SAT in the last 168 hours.  Liver Function Tests: No results for input(s): AST, ALT, ALKPHOS, BILITOT, PROT, ALBUMIN in the last 168 hours. No results for input(s): LIPASE, AMYLASE in the last 168 hours. No results for input(s): AMMONIA in the last 168 hours.  CBC: Recent Labs  Lab 12/23/17 0559  WBC 16.7*  HGB 13.5  HCT 42.6  MCV 90.8  PLT 263    Cardiac Enzymes: Recent Labs  Lab 12/22/17 1409 12/22/17 1557 12/22/17 2129  TROPONINI <0.03 <0.03 <0.03    BNP (last 3 results) No results for input(s): BNP in the last  8760 hours.  ProBNP (last 3 results) No results for input(s): PROBNP in the last 8760 hours.  Radiological Exams: No results found.  Assessment/Plan Active Problems:   Acute on chronic respiratory failure with hypoxia (HCC)   Atrial fibrillation with rapid ventricular response (HCC)   Acute renal failure due to tubular necrosis (HCC)   Empyema of left pleural space (HCC)   Community acquired pneumonia, bilateral   1. Acute on chronic respiratory failure with hypoxia we will continue with T collar trials continue pulmonary toilet secretion management. 2. Atrial fibrillation rate is controlled 3. Acute renal failure labs are stable monitoring 4. Empyema status post VATS 5. Community-acquired pneumonia treated improved   I have personally seen and evaluated the patient, evaluated laboratory and imaging results, formulated the assessment and plan and placed orders. The Patient requires high complexity decision making for assessment and support.  Case was discussed on Rounds with the Respiratory Therapy Staff  Yevonne Pax, MD [facility] Pulmonary Critical Care Medicine Sleep Medicine

## 2017-12-29 DIAGNOSIS — J9621 Acute and chronic respiratory failure with hypoxia: Secondary | ICD-10-CM | POA: Diagnosis not present

## 2017-12-29 DIAGNOSIS — I4891 Unspecified atrial fibrillation: Secondary | ICD-10-CM | POA: Diagnosis not present

## 2017-12-29 DIAGNOSIS — J189 Pneumonia, unspecified organism: Secondary | ICD-10-CM | POA: Diagnosis not present

## 2017-12-29 DIAGNOSIS — N170 Acute kidney failure with tubular necrosis: Secondary | ICD-10-CM | POA: Diagnosis not present

## 2017-12-29 LAB — BASIC METABOLIC PANEL
ANION GAP: 9 (ref 5–15)
BUN: 55 mg/dL — ABNORMAL HIGH (ref 8–23)
CALCIUM: 8.8 mg/dL — AB (ref 8.9–10.3)
CO2: 32 mmol/L (ref 22–32)
CREATININE: 0.69 mg/dL (ref 0.44–1.00)
Chloride: 102 mmol/L (ref 98–111)
GFR calc non Af Amer: >60 mL/min (ref >60)
GLUCOSE: 186 mg/dL — AB (ref 70–99)
Potassium: 4.9 mmol/L (ref 3.5–5.1)
Sodium: 143 mmol/L (ref 135–145)

## 2017-12-29 LAB — URINALYSIS, ROUTINE W REFLEX MICROSCOPIC
Bilirubin Urine: NEGATIVE
Glucose, UA: NEGATIVE mg/dL
Hgb urine dipstick: NEGATIVE
Ketones, ur: NEGATIVE mg/dL
Nitrite: NEGATIVE
Protein, ur: NEGATIVE mg/dL
Specific Gravity, Urine: 1.025 (ref 1.005–1.030)
pH: 6 (ref 5.0–8.0)

## 2017-12-29 LAB — CBC
HEMATOCRIT: 39.9 % (ref 36.0–46.0)
Hemoglobin: 12.3 g/dL (ref 12.0–15.0)
MCH: 28.9 pg (ref 26.0–34.0)
MCHC: 30.8 g/dL (ref 30.0–36.0)
MCV: 93.7 fL (ref 80.0–100.0)
NRBC: 0 % (ref 0.0–0.2)
Platelets: 156 10*3/uL (ref 150–400)
RBC: 4.26 MIL/uL (ref 3.87–5.11)
RDW: 19 % — ABNORMAL HIGH (ref 11.5–15.5)
WBC: 14.5 10*3/uL — AB (ref 4.0–10.5)

## 2017-12-29 NOTE — Progress Notes (Signed)
Pulmonary Critical Care Medicine [facility] [place]   PULMONARY CRITICAL CARE SERVICE  PROGRESS NOTE  Date of Service: 12/29/2017  Natasha Jennings  ZOX:[id number]  DOB: 1935-08-20   DOA: 12/16/2017  Referring Physician: Carron Curie, MD  HPI: Natasha Jennings is a 82 y.o. female seen for follow up of Acute on Chronic Respiratory Failure.  Patient is capping doing fairly well.  Was noted to have an increased heart rate and is under better control with medication primary care team is on top of it  Medications: Reviewed on Rounds  Physical Exam:  Vitals: Temperature 98.8 pulse 114 respiratory 28 blood pressure 137/77 saturation 97%  Ventilator Settings off the ventilator capping right now  . General: Comfortable at this time . Eyes: Grossly normal lids, irises & conjunctiva . ENT: grossly tongue is normal . Neck: no obvious mass . Cardiovascular: S1 S2 normal no gallop . Respiratory: No rhonchi or rales . Abdomen: soft . Skin: no rash seen on limited exam . Musculoskeletal: not rigid . Psychiatric:unable to assess . Neurologic: no seizure no involuntary movements         Lab Data:   Basic Metabolic Panel: Recent Labs  Lab 12/23/17 0559 12/23/17 2050 12/29/17 0510  NA 138  --  143  K 5.3* 4.5 4.9  CL 99  --  102  CO2 29  --  32  GLUCOSE 191*  --  186*  BUN 37*  --  55*  CREATININE 0.78  --  0.69  CALCIUM 9.0  --  8.8*    ABG: No results for input(s): PHART, PCO2ART, PO2ART, HCO3, O2SAT in the last 168 hours.  Liver Function Tests: No results for input(s): AST, ALT, ALKPHOS, BILITOT, PROT, ALBUMIN in the last 168 hours. No results for input(s): LIPASE, AMYLASE in the last 168 hours. No results for input(s): AMMONIA in the last 168 hours.  CBC: Recent Labs  Lab 12/23/17 0559 12/29/17 0510  WBC 16.7* 14.5*  HGB 13.5 12.3  HCT 42.6 39.9  MCV 90.8 93.7  PLT 263 156    Cardiac Enzymes: Recent Labs  Lab 12/22/17 1409 12/22/17 1557  12/22/17 2129  TROPONINI <0.03 <0.03 <0.03    BNP (last 3 results) No results for input(s): BNP in the last 8760 hours.  ProBNP (last 3 results) No results for input(s): PROBNP in the last 8760 hours.  Radiological Exams: No results found.  Assessment/Plan Active Problems:   Acute on chronic respiratory failure with hypoxia (HCC)   Atrial fibrillation with rapid ventricular response (HCC)   Acute renal failure due to tubular necrosis (HCC)   Empyema of left pleural space (HCC)   Community acquired pneumonia, bilateral   1. Acute on chronic respiratory failure hypoxia she is doing okay he has periodic tachycardia and patient is going to need to monitor closely if the heart rhythm continues to be tachycardic when I would on Her 2. Atrial fibrillation now has a rapid response primary care team is on top of it as noted above rate is now better controlled 3. Acute renal failure follow labs 4. Empyema status post VATS 5. Communicare pneumonia treated continue to monitor   I have personally seen and evaluated the patient, evaluated laboratory and imaging results, formulated the assessment and plan and placed orders. The Patient requires high complexity decision making for assessment and support.  Case was discussed on Rounds with the Respiratory Therapy Staff  Yevonne Pax, MD [facility] Pulmonary Critical Care Medicine Sleep Medicine

## 2017-12-30 ENCOUNTER — Other Ambulatory Visit (HOSPITAL_COMMUNITY): Payer: Self-pay

## 2017-12-30 DIAGNOSIS — J9621 Acute and chronic respiratory failure with hypoxia: Secondary | ICD-10-CM | POA: Diagnosis not present

## 2017-12-30 DIAGNOSIS — N170 Acute kidney failure with tubular necrosis: Secondary | ICD-10-CM | POA: Diagnosis not present

## 2017-12-30 DIAGNOSIS — J189 Pneumonia, unspecified organism: Secondary | ICD-10-CM | POA: Diagnosis not present

## 2017-12-30 DIAGNOSIS — I4891 Unspecified atrial fibrillation: Secondary | ICD-10-CM | POA: Diagnosis not present

## 2017-12-30 NOTE — Progress Notes (Signed)
Pulmonary Critical Care Medicine [facility] [place]   PULMONARY CRITICAL CARE SERVICE  PROGRESS NOTE  Date of Service: 12/30/2017  Natasha Jennings  [id number]  DOB: 1935/12/06   DOA: 12/16/2017  Referring Physician: Carron Curie, MD  HPI: Natasha Jennings is a 82 y.o. female seen for follow up of Acute on Chronic Respiratory Failure.  Patient is comfortable without distress is capping has been on room air right now has been capping for more than 48 hours  Medications: Reviewed on Rounds  Physical Exam:  Vitals: Temperature 97.6 pulse 112 respiratory rate 25 blood pressure 169/70 saturations 96%  Ventilator Settings off the ventilator capping  . General: Comfortable at this time . Eyes: Grossly normal lids, irises & conjunctiva . ENT: grossly tongue is normal . Neck: no obvious mass . Cardiovascular: S1 S2 normal no gallop . Respiratory: No rhonchi or rales are noted . Abdomen: soft . Skin: no rash seen on limited exam . Musculoskeletal: not rigid . Psychiatric:unable to assess . Neurologic: no seizure no involuntary movements         Lab Data:   Basic Metabolic Panel: Recent Labs  Lab 12/23/17 2050 12/29/17 0510  NA  --  143  K 4.5 4.9  CL  --  102  CO2  --  32  GLUCOSE  --  186*  BUN  --  55*  CREATININE  --  0.69  CALCIUM  --  8.8*    ABG: No results for input(s): PHART, PCO2ART, PO2ART, HCO3, O2SAT in the last 168 hours.  Liver Function Tests: No results for input(s): AST, ALT, ALKPHOS, BILITOT, PROT, ALBUMIN in the last 168 hours. No results for input(s): LIPASE, AMYLASE in the last 168 hours. No results for input(s): AMMONIA in the last 168 hours.  CBC: Recent Labs  Lab 12/29/17 0510  WBC 14.5*  HGB 12.3  HCT 39.9  MCV 93.7  PLT 156    Cardiac Enzymes: No results for input(s): CKTOTAL, CKMB, CKMBINDEX, TROPONINI in the last 168 hours.  BNP (last 3 results) No results for input(s): BNP in the last 8760 hours.  ProBNP  (last 3 results) No results for input(s): PROBNP in the last 8760 hours.  Radiological Exams: No results found.  Assessment/Plan Active Problems:   Acute on chronic respiratory failure with hypoxia (HCC)   Atrial fibrillation with rapid ventricular response (HCC)   Acute renal failure due to tubular necrosis (HCC)   Empyema of left pleural space (HCC)   Community acquired pneumonia, bilateral   1. Acute on chronic respiratory failure with hypoxia she is capping for more than 48 hours now hopefully we should be able to decannulate in the next day 2. Acute renal failure resolved we will monitor 3. Empyema treated 4. Community-acquired pneumonia resolved 5. Atrial fibrillation rate is controlled currently   I have personally seen and evaluated the patient, evaluated laboratory and imaging results, formulated the assessment and plan and placed orders. The Patient requires high complexity decision making for assessment and support.  Case was discussed on Rounds with the Respiratory Therapy Staff  Yevonne Pax, MD [facility] Pulmonary Critical Care Medicine Sleep Medicine

## 2017-12-31 DIAGNOSIS — J189 Pneumonia, unspecified organism: Secondary | ICD-10-CM | POA: Diagnosis not present

## 2017-12-31 DIAGNOSIS — N170 Acute kidney failure with tubular necrosis: Secondary | ICD-10-CM | POA: Diagnosis not present

## 2017-12-31 DIAGNOSIS — J9621 Acute and chronic respiratory failure with hypoxia: Secondary | ICD-10-CM | POA: Diagnosis not present

## 2017-12-31 DIAGNOSIS — I4891 Unspecified atrial fibrillation: Secondary | ICD-10-CM | POA: Diagnosis not present

## 2017-12-31 LAB — CULTURE, RESPIRATORY

## 2017-12-31 LAB — CULTURE, RESPIRATORY W GRAM STAIN

## 2017-12-31 NOTE — Progress Notes (Signed)
Pulmonary Critical Care Medicine [facility] [place]   PULMONARY CRITICAL CARE SERVICE  PROGRESS NOTE  Date of Service: 12/31/2017  Natasha Jennings  ZOX:[id number]  DOB: [birth year]   DOA: 12/16/2017  Referring Physician: Carron Curie, MD  HPI: Natasha Jennings is a 82 y.o. female seen for follow up of Acute on Chronic Respiratory Failure.  Capping right now patient's on room air  Medications: Reviewed on Rounds  Physical Exam:  Vitals: Temperature 97.2 pulse 105 respiratory rate 22 blood pressure 166/99 saturations are 96%  Ventilator Settings off the ventilator capping  . General: Comfortable at this time . Eyes: Grossly normal lids, irises & conjunctiva . ENT: grossly tongue is normal . Neck: no obvious mass . Cardiovascular: S1 S2 normal no gallop . Respiratory: No rhonchi or rales are noted . Abdomen: soft . Skin: no rash seen on limited exam . Musculoskeletal: not rigid . Psychiatric:unable to assess . Neurologic: no seizure no involuntary movements         Lab Data:   Basic Metabolic Panel: Recent Labs  Lab 12/29/17 0510  NA 143  K 4.9  CL 102  CO2 32  GLUCOSE 186*  BUN 55*  CREATININE 0.69  CALCIUM 8.8*    ABG: No results for input(s): PHART, PCO2ART, PO2ART, HCO3, O2SAT in the last 168 hours.  Liver Function Tests: No results for input(s): AST, ALT, ALKPHOS, BILITOT, PROT, ALBUMIN in the last 168 hours. No results for input(s): LIPASE, AMYLASE in the last 168 hours. No results for input(s): AMMONIA in the last 168 hours.  CBC: Recent Labs  Lab 12/29/17 0510  WBC 14.5*  HGB 12.3  HCT 39.9  MCV 93.7  PLT 156    Cardiac Enzymes: No results for input(s): CKTOTAL, CKMB, CKMBINDEX, TROPONINI in the last 168 hours.  BNP (last 3 results) No results for input(s): BNP in the last 8760 hours.  ProBNP (last 3 results) No results for input(s): PROBNP in the last 8760 hours.  Radiological Exams: Dg Chest Port 1 View  Result Date:  12/30/2017 CLINICAL DATA:  Follow up pneumonia. EXAM: PORTABLE CHEST 1 VIEW COMPARISON:  Chest radiograph December 17, 2017 FINDINGS: Cardiac silhouette is moderately enlarged and unchanged. Calcified aortic arch. Patchy RIGHT perihilar airspace opacity. Similar blunting of RIGHT costophrenic angle. Mild chronic interstitial changes. Strandy densities LEFT lung base. Biapical pleural thickening. No pneumothorax. Tracheostomy tube in situ. Soft tissue planes and included osseous structures are unchanged. IMPRESSION: 1. RIGHT perihilar airspace opacities seen with confluent edema or pneumonia. Small RIGHT pleural effusion versus pleural thickening. 2. Stable cardiomegaly and chronic interstitial changes. 3.  Aortic Atherosclerosis (ICD10-I70.0). Electronically Signed   By: Awilda Metro M.D.   On: 12/30/2017 14:51    Assessment/Plan Active Problems:   Acute on chronic respiratory failure with hypoxia (HCC)   Atrial fibrillation with rapid ventricular response (HCC)   Acute renal failure due to tubular necrosis (HCC)   Empyema of left pleural space (HCC)   Community acquired pneumonia, bilateral   1. Acute on chronic respiratory failure with hypoxia patient is going to continue with capping trials were working towards decannulation 2. Atrial fibrillation rate is controlled 3. Acute renal failure labs improve 4. Empyema status post VATS 5. Community-acquired pneumonia treated resolved   I have personally seen and evaluated the patient, evaluated laboratory and imaging results, formulated the assessment and plan and placed orders. The Patient requires high complexity decision making for assessment and support.  Case was discussed on Rounds with the  Respiratory Therapy Staff  Allyne Gee, MD [facility] Pulmonary Critical Care Medicine Sleep Medicine

## 2018-01-01 DIAGNOSIS — J9621 Acute and chronic respiratory failure with hypoxia: Secondary | ICD-10-CM | POA: Diagnosis not present

## 2018-01-01 DIAGNOSIS — N170 Acute kidney failure with tubular necrosis: Secondary | ICD-10-CM | POA: Diagnosis not present

## 2018-01-01 DIAGNOSIS — I4891 Unspecified atrial fibrillation: Secondary | ICD-10-CM | POA: Diagnosis not present

## 2018-01-01 DIAGNOSIS — J189 Pneumonia, unspecified organism: Secondary | ICD-10-CM | POA: Diagnosis not present

## 2018-01-01 NOTE — Progress Notes (Signed)
Pulmonary Critical Care Medicine [facility] [place]   PULMONARY CRITICAL CARE SERVICE  PROGRESS NOTE  Date of Service: 01/01/2018  Natasha Jennings  [id number]  DOB: [birth year]   DOA: 12/16/2017  Referring Physician: Carron Curie, MD  HPI: Natasha Jennings is a 82 y.o. female seen for follow up of Acute on Chronic Respiratory Failure.  Comfortable without distress at this time.  Patient is capping  Medications: Reviewed on Rounds  Physical Exam:  Vitals: Temperature 97.7 pulse 105 respiratory rate 20 blood pressure 160/80 saturations 99%  Ventilator Settings capping off the ventilator  . General: Comfortable at this time . Eyes: Grossly normal lids, irises & conjunctiva . ENT: grossly tongue is normal . Neck: no obvious mass . Cardiovascular: S1 S2 normal no gallop . Respiratory: No rhonchi no rales . Abdomen: soft . Skin: no rash seen on limited exam . Musculoskeletal: not rigid . Psychiatric:unable to assess . Neurologic: no seizure no involuntary movements         Lab Data:   Basic Metabolic Panel: Recent Labs  Lab 12/29/17 0510  NA 143  K 4.9  CL 102  CO2 32  GLUCOSE 186*  BUN 55*  CREATININE 0.69  CALCIUM 8.8*    ABG: No results for input(s): PHART, PCO2ART, PO2ART, HCO3, O2SAT in the last 168 hours.  Liver Function Tests: No results for input(s): AST, ALT, ALKPHOS, BILITOT, PROT, ALBUMIN in the last 168 hours. No results for input(s): LIPASE, AMYLASE in the last 168 hours. No results for input(s): AMMONIA in the last 168 hours.  CBC: Recent Labs  Lab 12/29/17 0510  WBC 14.5*  HGB 12.3  HCT 39.9  MCV 93.7  PLT 156    Cardiac Enzymes: No results for input(s): CKTOTAL, CKMB, CKMBINDEX, TROPONINI in the last 168 hours.  BNP (last 3 results) No results for input(s): BNP in the last 8760 hours.  ProBNP (last 3 results) No results for input(s): PROBNP in the last 8760 hours.  Radiological Exams: Dg Chest Port 1  View  Result Date: 12/30/2017 CLINICAL DATA:  Follow up pneumonia. EXAM: PORTABLE CHEST 1 VIEW COMPARISON:  Chest radiograph December 17, 2017 FINDINGS: Cardiac silhouette is moderately enlarged and unchanged. Calcified aortic arch. Patchy RIGHT perihilar airspace opacity. Similar blunting of RIGHT costophrenic angle. Mild chronic interstitial changes. Strandy densities LEFT lung base. Biapical pleural thickening. No pneumothorax. Tracheostomy tube in situ. Soft tissue planes and included osseous structures are unchanged. IMPRESSION: 1. RIGHT perihilar airspace opacities seen with confluent edema or pneumonia. Small RIGHT pleural effusion versus pleural thickening. 2. Stable cardiomegaly and chronic interstitial changes. 3.  Aortic Atherosclerosis (ICD10-I70.0). Electronically Signed   By: Awilda Metro M.D.   On: 12/30/2017 14:51    Assessment/Plan Active Problems:   Acute on chronic respiratory failure with hypoxia (HCC)   Atrial fibrillation with rapid ventricular response (HCC)   Acute renal failure due to tubular necrosis (HCC)   Empyema of left pleural space (HCC)   Community acquired pneumonia, bilateral   1. Acute on chronic respiratory failure with hypoxia we will proceed to decannulation patient's been capped for more than 3 days 2. Atrial fibrillation rate is controlled 3. Acute renal failure stable we will continue to monitor 4. Empyema 5. Community-acquired pneumonia continue with supportive care clinically is improving   I have personally seen and evaluated the patient, evaluated laboratory and imaging results, formulated the assessment and plan and placed orders. The Patient requires high complexity decision making for assessment and support.  Case was discussed on Rounds with the Respiratory Therapy Staff  Allyne Gee, MD [facility] Pulmonary Critical Care Medicine Sleep Medicine

## 2018-01-05 ENCOUNTER — Other Ambulatory Visit (HOSPITAL_COMMUNITY): Payer: Self-pay

## 2018-01-08 LAB — SUSCEPTIBILITY RESULT

## 2018-01-08 LAB — SUSCEPTIBILITY, AER + ANAEROB

## 2018-01-13 LAB — URINE CULTURE: Culture: >=100000 — AB

## 2019-07-02 DEATH — deceased

## 2020-03-25 IMAGING — DX DG ABDOMEN 1V
1 series · 1 of 1 positions shown · non-contrast
Comparison: None.

CLINICAL DATA: 82 y/o F; PEG tube placement. 40 cc contrast infused
via the tube.

EXAM:
ABDOMEN - 1 VIEW

[abdomen]
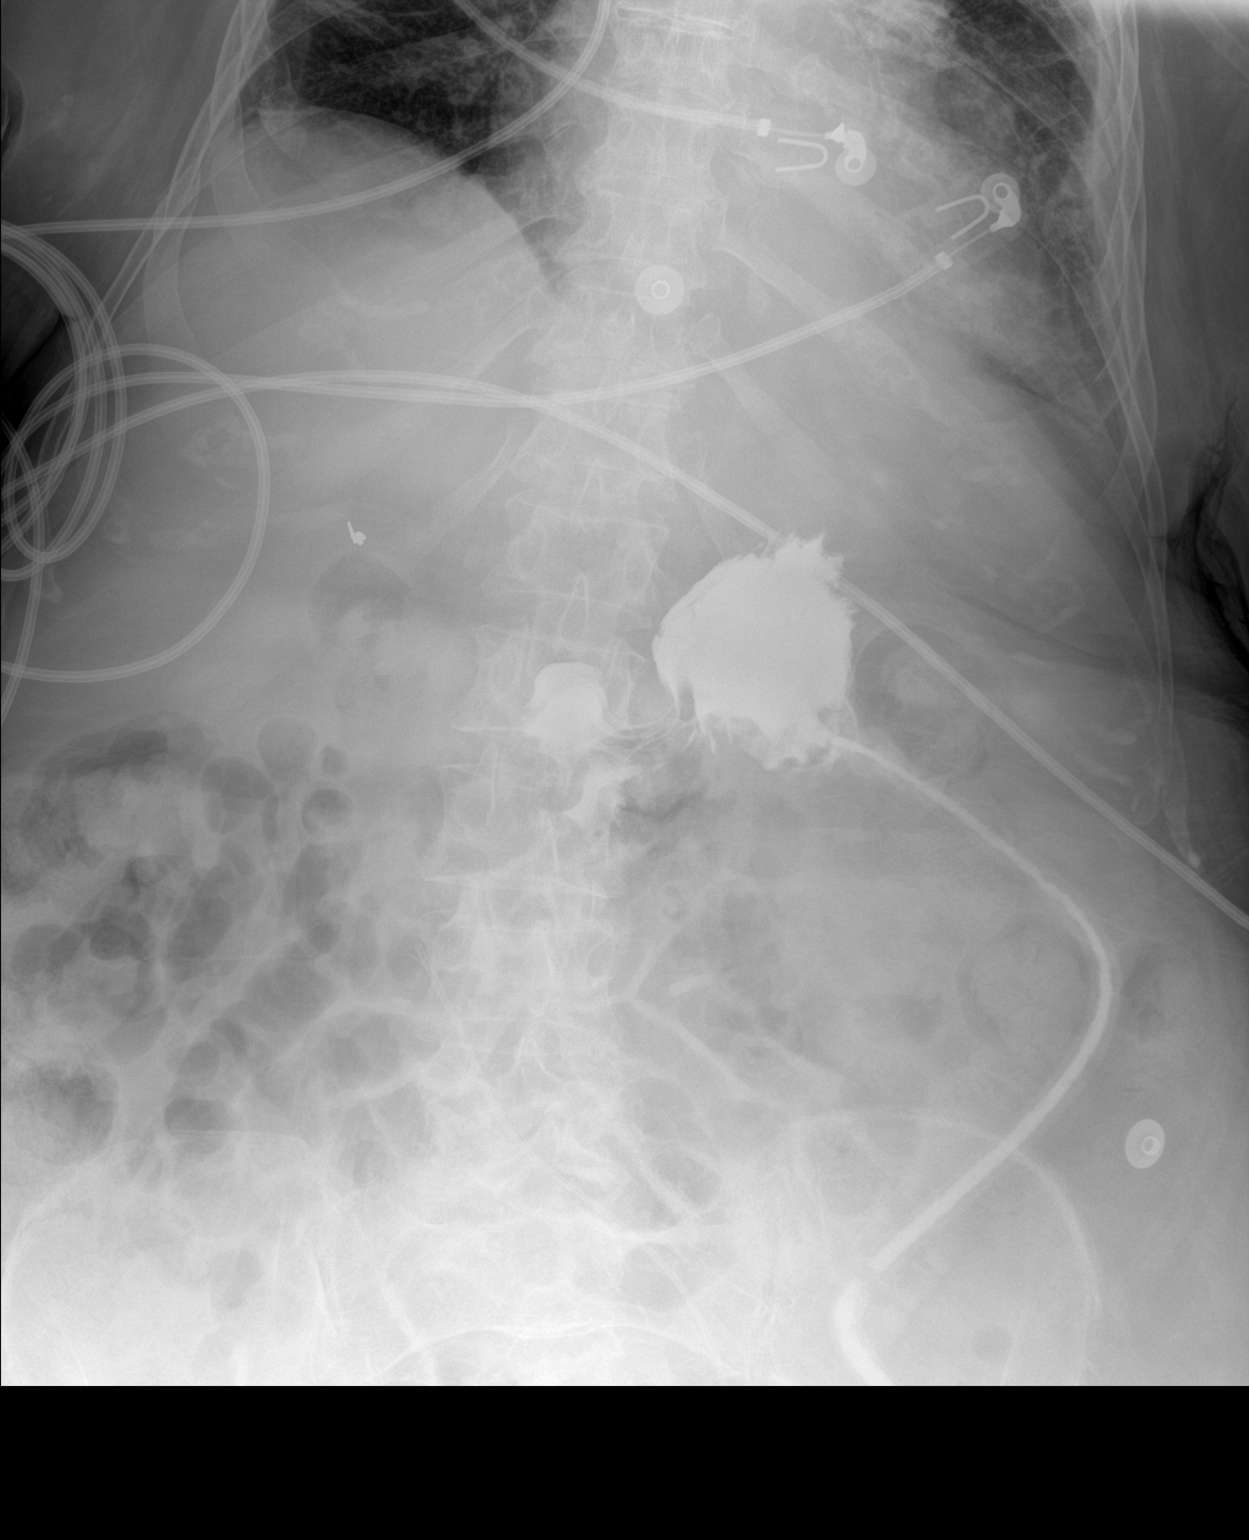

[1 of 1 positions shown; findings below may reference images not displayed]

FINDINGS: Contrast is seen accumulating within the lumen of the stomach. No
extravasation is identified on this single frontal view. Right upper
quadrant surgical clips, presumably cholecystectomy. Left femoral
line noted with tip projecting to the right of L4 vertebral body.
Normal bowel gas pattern. No acute osseous abnormality is evident.
IMPRESSION: Contrast is accumulating within the lumen of the stomach. No
extravasation is identified on this single frontal view.

By: Hillary Chibaya Vakacha M.D.

## 2020-03-25 IMAGING — DX DG CHEST 1V PORT
1 series · 1 of 1 positions shown · non-contrast
Comparison: None.

CLINICAL DATA: 82 y/o  F; tracheostomy.

EXAM:
PORTABLE CHEST 1 VIEW

[chest]
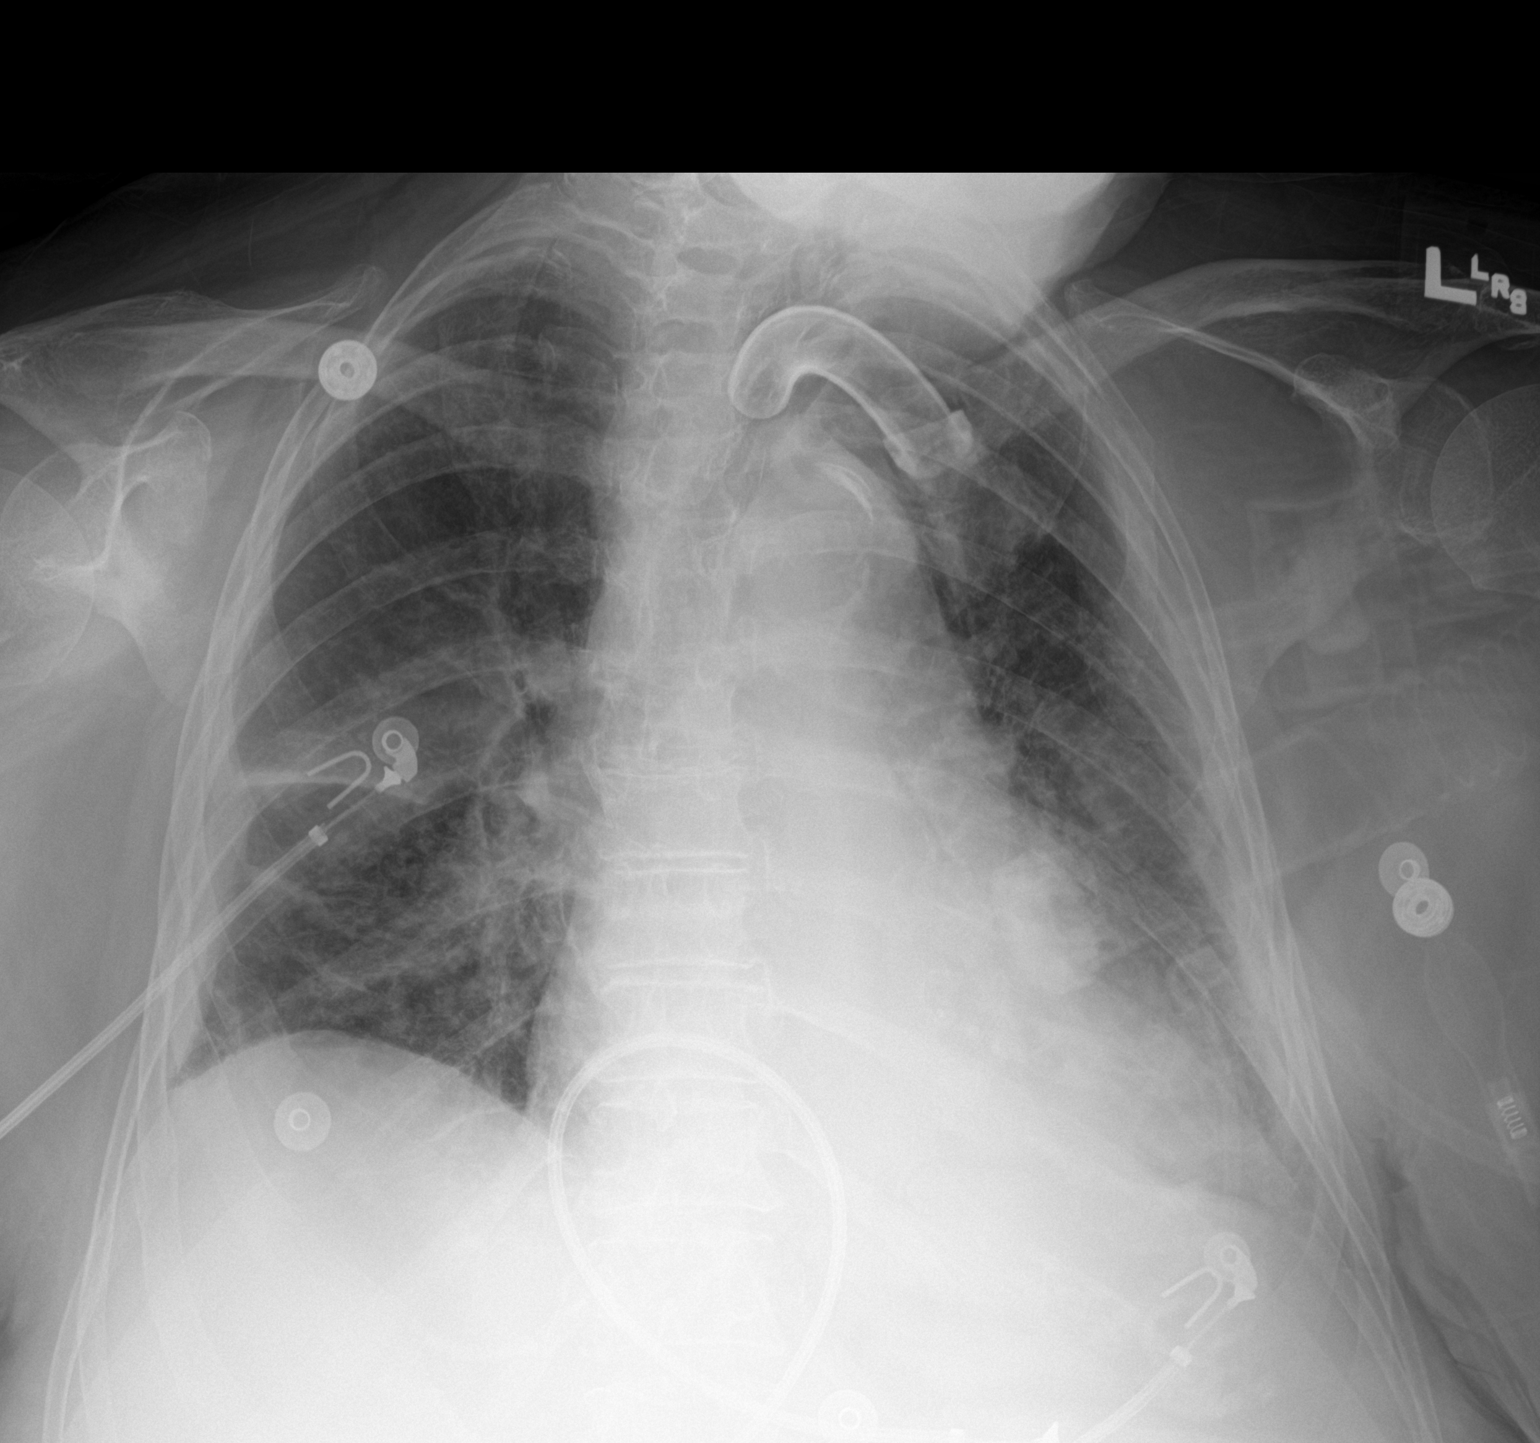

[1 of 1 positions shown; findings below may reference images not displayed]

FINDINGS: Mildly enlarged cardiac silhouette given projection and technique.
Small bilateral pleural effusions and hazy opacification of the
lungs. Tracheostomy tube tip projects over the tracheal air column.
Calcific aortic atherosclerosis. Multilevel degenerative changes of
the spine. No acute osseous abnormality is evident.
IMPRESSION: Small bilateral pleural effusions and hazy opacification of the
lungs which may represent pulmonary edema. Underlying pneumonia is
not excluded.

Tracheostomy tube tip projects over the tracheal air column.

By: Che Wah Billie M.D.

## 2020-03-29 IMAGING — DX DG ABD PORTABLE 1V
1 series · 1 of 1 positions shown · non-contrast
Comparison: 12/17/2017

CLINICAL DATA: Peg placement

EXAM:
PORTABLE ABDOMEN - 1 VIEW

[abdomen]
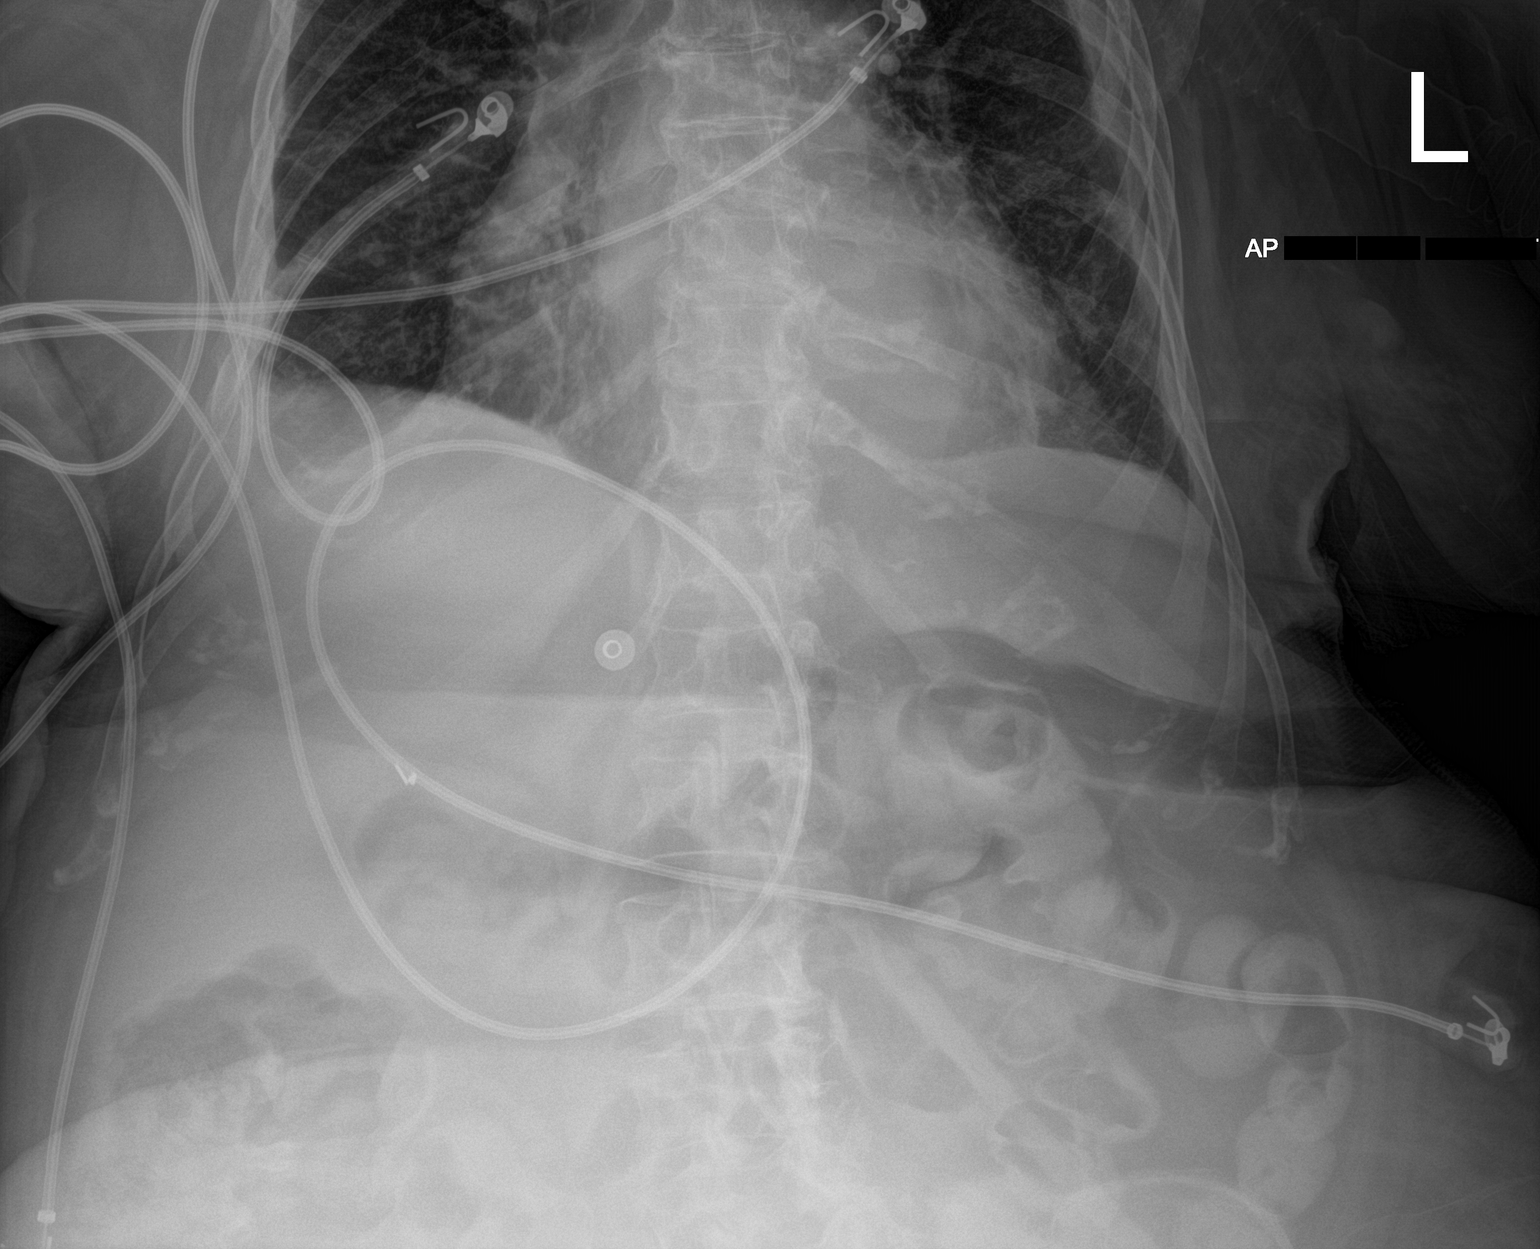

[1 of 1 positions shown; findings below may reference images not displayed]

FINDINGS: Gastrostomy catheter is noted overlying the gastric lumen although
no contrast was administered for this exam to confirm placement.
Contrast from a previous injection is noted within the colon. No
free air is seen.
IMPRESSION: Gastrostomy catheter overlying the gastric lumen.

## 2020-04-07 IMAGING — DX DG CHEST 1V PORT
1 series · 1 of 1 positions shown · non-contrast
Comparison: Chest radiograph December 17, 2017

CLINICAL DATA: Follow up pneumonia.

EXAM:
PORTABLE CHEST 1 VIEW

[chest ap]
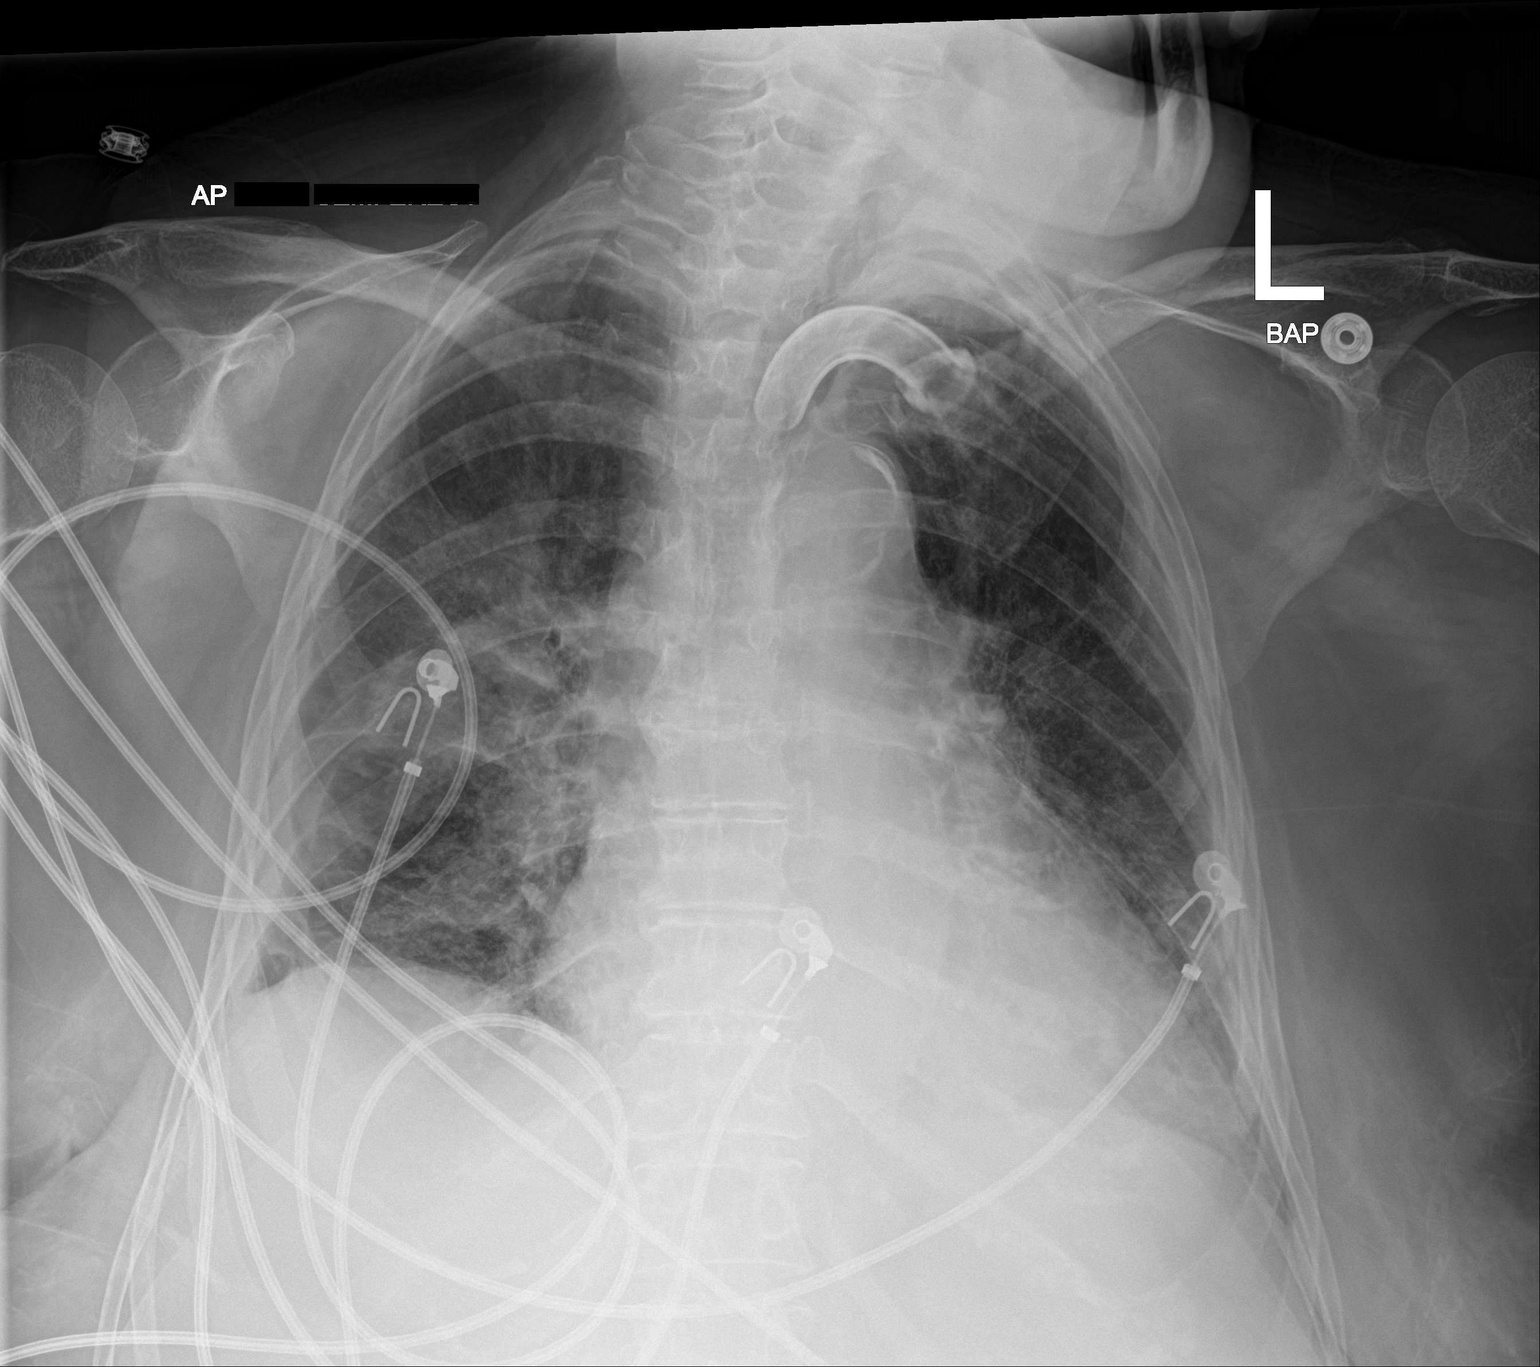

[1 of 1 positions shown; findings below may reference images not displayed]

FINDINGS: Cardiac silhouette is moderately enlarged and unchanged. Calcified
aortic arch. Patchy RIGHT perihilar airspace opacity. Similar
blunting of RIGHT costophrenic angle. Mild chronic interstitial
changes. Strandy densities LEFT lung base. Biapical pleural
thickening. No pneumothorax. Tracheostomy tube in situ. Soft tissue
planes and included osseous structures are unchanged.
IMPRESSION: 1. RIGHT perihilar airspace opacities seen with confluent edema or
pneumonia. Small RIGHT pleural effusion versus pleural thickening.
2. Stable cardiomegaly and chronic interstitial changes.
3.  Aortic Atherosclerosis ([id number]).
